# Patient Record
Sex: Female | Born: 1965 | Race: White | Hispanic: No | Marital: Married | Smoking: Former smoker
Health system: Southern US, Community
[De-identification: ages and names within clinical notes are randomized; demographics above are authoritative.]

## PROBLEM LIST (undated history)

## (undated) DIAGNOSIS — R000 Tachycardia, unspecified: Secondary | ICD-10-CM

## (undated) DIAGNOSIS — K219 Gastro-esophageal reflux disease without esophagitis: Secondary | ICD-10-CM

## (undated) DIAGNOSIS — E039 Hypothyroidism, unspecified: Secondary | ICD-10-CM

## (undated) DIAGNOSIS — T7840XA Allergy, unspecified, initial encounter: Secondary | ICD-10-CM

## (undated) HISTORY — DX: Hypothyroidism, unspecified: E03.9

## (undated) HISTORY — DX: Gastro-esophageal reflux disease without esophagitis: K21.9

## (undated) HISTORY — DX: Allergy, unspecified, initial encounter: T78.40XA

## (undated) HISTORY — DX: Tachycardia, unspecified: R00.0

---

## 2010-05-04 ENCOUNTER — Encounter: Payer: Self-pay | Admitting: Cardiology

## 2010-05-14 ENCOUNTER — Encounter: Payer: Self-pay | Admitting: Cardiology

## 2010-05-16 ENCOUNTER — Encounter: Payer: Self-pay | Admitting: Cardiology

## 2010-05-24 ENCOUNTER — Ambulatory Visit: Payer: Self-pay | Admitting: Cardiology

## 2010-05-24 DIAGNOSIS — I498 Other specified cardiac arrhythmias: Secondary | ICD-10-CM | POA: Insufficient documentation

## 2010-10-18 NOTE — Letter (Signed)
Summary: External Correspondence/ OFFICE VISIT [facility]  External Correspondence/ OFFICE VISIT [facility] [place]   Imported By: Dorise Hiss 05/16/2010 16:49:17  _____________________________________________________________________  External Attachment:    Type:   Image     Comment:   External Document

## 2010-10-18 NOTE — Assessment & Plan Note (Signed)
Summary: NP6/ TACKYCARDIA. PT HAS BCBS/. GD   Visit Type:  Initial Consult Primary Provider:  Golden Hurter  CC:  Tachycardia.  History of Present Illness: The patient presents for evaluation of tachycardia. She has had this off and on for many years. It comes and goes sporadically. She's had episodes lasting up to 2 hours but typically last for a few minutes. It goes away spontaneously though she has tried a couple of vagal maneuvers. She presented to her primary care and finally had this captured the other day. It was a regular tachycardia at a rate of 127 with no discernible P waves suggestive of a reentrant tachycardia. There was a slight R. prime in lead V1. She has had no presyncope or syncope with this. She says it's noticeable but not particularly symptomatic. She was started on 12 1/2 mg of metoprolol b.i.d. but has only been taking the evening dose as she does not want to be fatigued during the day. Otherwise exercises twice weekly and denies any chest pressure, neck or arm discomfort. She has no shortness of breath, PND or orthopnea.  Preventive Screening-Counseling & Management  Alcohol-Tobacco     Smoking Status: quit     Year Quit: 2006  Current Medications (verified): 1)  Metoprolol Tartrate 25 Mg Tabs (Metoprolol Tartrate) .... Take 1/2 Tablet By Mouth Two Times A Day 2)  Levothyroxine Sodium 100 Mcg Tabs (Levothyroxine Sodium) .... Take 1 Tablet By Mouth Once A Day  Allergies (verified): 1)  ! Asa  Comments:  Nurse/Medical Assistant: The patient's medications and allergies were verbally reviewed with the patient and were updated in the Medication and Allergy Lists.  Past History:  Past Medical History: Hypothyroidism  Past Surgical History: None  Family History: Father hyperglycemic Mother no cardiac disease No sudden death, no heart failure  Social History: Runner, broadcasting/film/video, high school Live with husband and step daughter Quit tobacco x 5 years (20  years sporadic) Smoking Status:  quit  Review of Systems       As stated in the HPI and negative for all other systems.   Vital Signs:  Patient profile:   45 year old female Height:      68 inches Weight:      169 pounds BMI:     25.79 Pulse rate:   73 / minute BP sitting:   114 / 73  (left arm) Cuff size:   regular  Vitals Entered By: Carlye Grippe (May 24, 2010 3:58 PM)  Nutrition Counseling: Patient's BMI is greater than 25 and therefore counseled on weight management options.  Physical Exam  General:  Well developed, well nourished, in no acute distress. Head:  normocephalic and atraumatic Eyes:  PERRLA/EOM intact; conjunctiva and lids normal. Mouth:  Teeth, gums and palate normal. Oral mucosa normal. Neck:  Neck supple, no JVD. No masses, thyromegaly or abnormal cervical nodes. Chest Wall:  no deformities or breast masses noted Lungs:  Clear bilaterally to auscultation and percussion. Abdomen:  Bowel sounds positive; abdomen soft and non-tender without masses, organomegaly, or hernias noted. No hepatosplenomegaly. Msk:  Back normal, normal gait. Muscle strength and tone normal. Extremities:  No clubbing or cyanosis. Neurologic:  Alert and oriented x 3. Skin:  Intact without lesions or rashes. Cervical Nodes:  no significant adenopathy Inguinal Nodes:  no significant adenopathy Psych:  Normal affect.   Detailed Cardiovascular Exam  Neck    Carotids: Carotids full and equal bilaterally without bruits.      Neck Veins: Normal,  no JVD.    Heart    Inspection: no deformities or lifts noted.      Palpation: normal PMI with no thrills palpable.      Auscultation: regular rate and rhythm, S1, S2 without murmurs, rubs, gallops, or clicks.    Vascular    Abdominal Aorta: no palpable masses, pulsations, or audible bruits.      Femoral Pulses: normal femoral pulses bilaterally.      Pedal Pulses: normal pedal pulses bilaterally.      Radial Pulses: normal  radial pulses bilaterally.      Peripheral Circulation: no clubbing, cyanosis, or edema noted with normal capillary refill.     EKG  Procedure date:  05/14/2010  Findings:      Sinus rhythm, rate 94, axis within normal limits, intervals within normal limits, no acute ST-T wave changes  Impression & Recommendations:  Problem # 1:  SUPRAVENTRICULAR TACHYCARDIA (ICD-427.89) The patient has a supraventricular tachycardia. I cannot discern the etiology specifically but it may be reentrant. We discussed further vagal maneuvers. She would rather not take the beta blocker so we talked about a pill in the pocket approach. I reviewed labs and her TSH and electrolytes were normal recently. Since these are not overly symptomatic and have been long-standing she prefers not to pursue more aggressive evaluation or treatment. However, she will let us know if she has any increasing symptoms.  Patient Instructions: 1)  Follow up as needed

## 2012-02-05 ENCOUNTER — Other Ambulatory Visit: Payer: Self-pay | Admitting: Family Medicine

## 2012-02-05 ENCOUNTER — Ambulatory Visit (HOSPITAL_COMMUNITY)
Admission: RE | Admit: 2012-02-05 | Discharge: 2012-02-05 | Disposition: A | Discharge status: Home / Self Care | Payer: BC Managed Care – PPO | Source: Ambulatory Visit | Attending: Family Medicine | Admitting: Family Medicine

## 2012-02-05 DIAGNOSIS — R1031 Right lower quadrant pain: Secondary | ICD-10-CM

## 2012-02-05 DIAGNOSIS — R509 Fever, unspecified: Secondary | ICD-10-CM | POA: Insufficient documentation

## 2012-02-05 DIAGNOSIS — R1011 Right upper quadrant pain: Secondary | ICD-10-CM | POA: Insufficient documentation

## 2012-02-05 DIAGNOSIS — R9389 Abnormal findings on diagnostic imaging of other specified body structures: Secondary | ICD-10-CM | POA: Insufficient documentation

## 2012-02-05 MED ORDER — IOHEXOL 300 MG/ML  SOLN
100.0000 mL | Freq: Once | INTRAMUSCULAR | Status: AC | PRN
  Administered 2012-02-05: 100 mL via INTRAVENOUS

## 2013-02-25 ENCOUNTER — Other Ambulatory Visit: Payer: Self-pay | Admitting: Nurse Practitioner

## 2013-03-18 ENCOUNTER — Other Ambulatory Visit: Payer: Self-pay | Admitting: Nurse Practitioner

## 2016-04-04 ENCOUNTER — Other Ambulatory Visit: Payer: Self-pay | Admitting: Family Medicine

## 2016-04-04 DIAGNOSIS — Z1231 Encounter for screening mammogram for malignant neoplasm of breast: Secondary | ICD-10-CM

## 2016-04-19 ENCOUNTER — Encounter (HOSPITAL_COMMUNITY): Payer: Self-pay | Admitting: Emergency Medicine

## 2016-04-19 ENCOUNTER — Emergency Department (HOSPITAL_COMMUNITY): Payer: BC Managed Care – PPO

## 2016-04-19 ENCOUNTER — Emergency Department (HOSPITAL_COMMUNITY)
Admission: EM | Admit: 2016-04-19 | Discharge: 2016-04-19 | Disposition: A | Discharge status: Home / Self Care | Payer: BC Managed Care – PPO | Attending: Emergency Medicine | Admitting: Emergency Medicine

## 2016-04-19 DIAGNOSIS — E039 Hypothyroidism, unspecified: Secondary | ICD-10-CM | POA: Insufficient documentation

## 2016-04-19 DIAGNOSIS — Z79899 Other long term (current) drug therapy: Secondary | ICD-10-CM | POA: Insufficient documentation

## 2016-04-19 DIAGNOSIS — Z87891 Personal history of nicotine dependence: Secondary | ICD-10-CM | POA: Insufficient documentation

## 2016-04-19 DIAGNOSIS — R0789 Other chest pain: Secondary | ICD-10-CM | POA: Diagnosis not present

## 2016-04-19 DIAGNOSIS — R079 Chest pain, unspecified: Secondary | ICD-10-CM | POA: Diagnosis present

## 2016-04-19 LAB — CBC WITH DIFFERENTIAL/PLATELET
Basophils Absolute: 0 10*3/uL (ref 0.0–0.1)
Basophils Relative: 0 %
Eosinophils Absolute: 0.3 10*3/uL (ref 0.0–0.7)
Eosinophils Relative: 5 %
HEMATOCRIT: 40 % (ref 36.0–46.0)
HEMOGLOBIN: 13.3 g/dL (ref 12.0–15.0)
LYMPHS ABS: 1.8 10*3/uL (ref 0.7–4.0)
LYMPHS PCT: 34 %
MCH: 31.6 pg (ref 26.0–34.0)
MCHC: 33.3 g/dL (ref 30.0–36.0)
MCV: 95 fL (ref 78.0–100.0)
MONOS PCT: 10 %
Monocytes Absolute: 0.5 10*3/uL (ref 0.1–1.0)
NEUTROS ABS: 2.6 10*3/uL (ref 1.7–7.7)
NEUTROS PCT: 51 %
Platelets: 215 10*3/uL (ref 150–400)
RBC: 4.21 MIL/uL (ref 3.87–5.11)
RDW: 13 % (ref 11.5–15.5)
WBC: 5.2 10*3/uL (ref 4.0–10.5)

## 2016-04-19 LAB — BASIC METABOLIC PANEL
ANION GAP: 5 (ref 5–15)
BUN: 17 mg/dL (ref 6–20)
CO2: 27 mmol/L (ref 22–32)
Calcium: 8.8 mg/dL — ABNORMAL LOW (ref 8.9–10.3)
Chloride: 105 mmol/L (ref 101–111)
Creatinine, Ser: 0.62 mg/dL (ref 0.44–1.00)
GFR calc Af Amer: >60 mL/min (ref >60)
GFR calc non Af Amer: >60 mL/min (ref >60)
GLUCOSE: 107 mg/dL — AB (ref 65–99)
POTASSIUM: 3.5 mmol/L (ref 3.5–5.1)
Sodium: 137 mmol/L (ref 135–145)

## 2016-04-19 LAB — HEPATIC FUNCTION PANEL
ALT: 18 U/L (ref 14–54)
AST: 21 U/L (ref 15–41)
Albumin: 4 g/dL (ref 3.5–5.0)
Alkaline Phosphatase: 60 U/L (ref 38–126)
BILIRUBIN DIRECT: 0.1 mg/dL (ref 0.1–0.5)
Indirect Bilirubin: 0.3 mg/dL (ref 0.3–0.9)
Total Bilirubin: 0.4 mg/dL (ref 0.3–1.2)
Total Protein: 6.8 g/dL (ref 6.5–8.1)

## 2016-04-19 LAB — TROPONIN I: Troponin I: <0.03 ng/mL (ref <0.03)

## 2016-04-19 LAB — I-STAT TROPONIN, ED: Troponin i, poc: 0 ng/mL (ref 0.00–0.08)

## 2016-04-19 MED ORDER — IBUPROFEN 800 MG PO TABS: 800.0000 mg | ORAL_TABLET | Freq: Three times a day (TID) | ORAL | 0 refills | Status: AC | PRN

## 2016-04-19 NOTE — ED Triage Notes (Signed)
Pt reports chest pain since this am. Pt reports radiation to left arm. Pt reports history of tachycardia. Pt reports went to PCP and was sent here.

## 2016-04-19 NOTE — Discharge Instructions (Signed)
Follow-up with the cardiology group in [place] in 1-2 weeksThe doctor use all before with his cardiology group was Dr. Antoine Poche

## 2016-04-19 NOTE — ED Provider Notes (Signed)
[facility] DEPT Provider Note   CSN: [id number] Arrival date & time: 04/19/16  1534  First Provider Contact:  First MD Initiated Contact with Patient 04/19/16 1554        History   Chief Complaint Chief Complaint  Patient presents with  . Chest Pain    HPI Leslie Gardner is a 50 y.o. female.  Patient complains of left-sided chest pain running down her left arm. Patient has a history of supraventricular tachycardia that she saw a cardiologist years ago about. Patient decided not to have ablation at that time but she has approximately 2 episodes of tachycardia month   The history is provided by the patient. No language interpreter was used.  Chest Pain   This is a new problem. The current episode started 6 to 12 hours ago. The problem occurs rarely. The problem has been resolved. Associated with: Nothing. Pain location: Left chest. The pain is at a severity of 4/10. The pain is mild. The quality of the pain is described as dull. Radiates to: Left arm. Exacerbated by: Worsened by nothing. Not related to exertion or deep breath. Pertinent negatives include no abdominal pain, no back pain, no cough and no headaches.  Pertinent negatives for past medical history include no seizures.    Past Medical History:  Diagnosis Date  . Hypothyroidism     Patient Active Problem List   Diagnosis Date Noted  . SUPRAVENTRICULAR TACHYCARDIA 05/24/2010    History reviewed. No pertinent surgical history.  OB History    No data available       Home Medications    Prior to Admission medications   Medication Sig Start Date End Date Taking? Authorizing Provider  Calcium-Vitamin D-Vitamin K (VIACTIV PO) Take 1 each by mouth once a week.   Yes Historical Provider, MD  Cyanocobalamin (B-12 PO) Take 1 tablet by mouth once a week.   Yes Historical Provider, MD  levothyroxine (SYNTHROID, LEVOTHROID) 100 MCG tablet TAKE 1 TABLET BY MOUTH DAILY 02/25/13  Yes Mary-Margaret Daphine Deutscher, FNP  metoprolol  tartrate (LOPRESSOR) 25 MG tablet Take 25 mg by mouth daily as needed (for tachycardia).   Yes Historical Provider, MD  ibuprofen (ADVIL,MOTRIN) 800 MG tablet Take 1 tablet (800 mg total) by mouth every 8 (eight) hours as needed for moderate pain. 04/19/16   Bethann Berkshire, MD    Family History History reviewed. No pertinent family history.  Social History Social History  Substance Use Topics  . Smoking status: Former Smoker    Types: Cigarettes    Quit date: 10/15/2006  . Smokeless tobacco: Never Used  . Alcohol use Yes     Comment: daily wine use     Allergies   Aspirin   Review of Systems Review of Systems  Constitutional: Negative for appetite change and fatigue.  HENT: Negative for congestion, ear discharge and sinus pressure.   Eyes: Negative for discharge.  Respiratory: Negative for cough.   Cardiovascular: Positive for chest pain.  Gastrointestinal: Negative for abdominal pain and diarrhea.  Genitourinary: Negative for frequency and hematuria.  Musculoskeletal: Negative for back pain.  Skin: Negative for rash.  Neurological: Negative for seizures and headaches.  Psychiatric/Behavioral: Negative for hallucinations.     Physical Exam Updated Vital Signs BP 117/81   Pulse 72   Temp 98.3 F (36.8 C) (Oral)   Resp 21   Ht  (1.727 m)   Wt 180 lb (81.6 kg)   SpO2 97%   BMI 27.37 kg/m   Physical Exam  Constitutional: She is oriented to person, place, and time. She appears well-developed.  HENT:  Head: Normocephalic.  Eyes: Conjunctivae and EOM are normal. No scleral icterus.  Neck: Neck supple. No thyromegaly present.  Cardiovascular: Normal rate and regular rhythm.  Exam reveals no gallop and no friction rub.   No murmur heard. Pulmonary/Chest: No stridor. She has no wheezes. She has no rales. She exhibits no tenderness.  Abdominal: She exhibits no distension. There is no tenderness. There is no rebound.  Musculoskeletal: Normal range of motion. She  exhibits no edema.  Lymphadenopathy:    She has no cervical adenopathy.  Neurological: She is oriented to person, place, and time. She exhibits normal muscle tone. Coordination normal.  Skin: No rash noted. No erythema.  Psychiatric: She has a normal mood and affect. Her behavior is normal.     ED Treatments / Results  Labs (all labs ordered are listed, but only abnormal results are displayed) Labs Reviewed  BASIC METABOLIC PANEL - Abnormal; Notable for the following:       Result Value   Glucose, Bld 107 (*)    Calcium 8.8 (*)    All other components within normal limits  TROPONIN I  CBC WITH DIFFERENTIAL/PLATELET  HEPATIC FUNCTION PANEL  I-STAT TROPOININ, ED    EKG  EKG Interpretation  Date/Time:  Wednesday April 19 2016 15:35:37 EDT Ventricular Rate:  73 PR Interval:  142 QRS Duration: 98 QT Interval:  420 QTC Calculation: 462 R Axis:   69 Text Interpretation:  Normal sinus rhythm Possible Left atrial enlargement Incomplete right bundle branch block Borderline ECG Confirmed by Montrey Buist  MD, Hannan Hutmacher [phone]) on 04/19/2016 4:29:51 PM       Radiology Dg Chest 2 View  Result Date: 04/19/2016 CLINICAL DATA:  Left-sided chest pain with radicular symptoms into left upper extremity for 1 day EXAM: CHEST  2 VIEW COMPARISON:  None. FINDINGS: The lungs are clear. The heart size and pulmonary vascularity are normal. No adenopathy. No pneumothorax. No bone lesions. IMPRESSION: No edema or consolidation. Electronically Signed   By: Bretta Bang III M.D.   On: 04/19/2016 16:35    Procedures Procedures (including critical care time)  Medications Ordered in ED Medications - No data to display   Initial Impression / Assessment and Plan / ED Course  I have reviewed the triage vital signs and the nursing notes.  Pertinent labs & imaging results that were available during my care of the patient were reviewed by me and considered in my medical decision making (see chart for  details).  Clinical Course    Patient with chest pain radiating down her left arm. EKG unremarkable troponin 2 is normal. Patient improved while in the emergency department. Patient is sent home with Motrin and referred to cardiology because she has had multiple episodes of SVT consult cardiology 6 years ago about it.  Final Clinical Impressions(s) / ED Diagnoses   Final diagnoses:  Other chest pain    New Prescriptions New Prescriptions   IBUPROFEN (ADVIL,MOTRIN) 800 MG TABLET    Take 1 tablet (800 mg total) by mouth every 8 (eight) hours as needed for moderate pain.     Bethann Berkshire, MD 04/19/16 1945

## 2016-05-05 ENCOUNTER — Ambulatory Visit
Admission: RE | Admit: 2016-05-05 | Discharge: 2016-05-05 | Disposition: A | Discharge status: Home / Self Care | Payer: BC Managed Care – PPO | Source: Ambulatory Visit | Attending: Family Medicine | Admitting: Family Medicine

## 2016-05-05 DIAGNOSIS — Z1231 Encounter for screening mammogram for malignant neoplasm of breast: Secondary | ICD-10-CM

## 2017-08-23 ENCOUNTER — Ambulatory Visit: Payer: BC Managed Care – PPO | Admitting: Urgent Care

## 2017-08-23 ENCOUNTER — Encounter: Payer: Self-pay | Admitting: Urgent Care

## 2017-08-23 ENCOUNTER — Other Ambulatory Visit: Payer: Self-pay

## 2017-08-23 VITALS — BP 118/76 | HR 78 | Temp 98.6°F | Resp 16 | Ht 66.5 in | Wt 173.4 lb

## 2017-08-23 DIAGNOSIS — B9789 Other viral agents as the cause of diseases classified elsewhere: Secondary | ICD-10-CM

## 2017-08-23 DIAGNOSIS — J069 Acute upper respiratory infection, unspecified: Secondary | ICD-10-CM

## 2017-08-23 DIAGNOSIS — J029 Acute pharyngitis, unspecified: Secondary | ICD-10-CM

## 2017-08-23 MED ORDER — BENZONATATE 100 MG PO CAPS: 100.0000 mg | ORAL_CAPSULE | Freq: Three times a day (TID) | ORAL | 0 refills | Status: DC | PRN

## 2017-08-23 MED ORDER — HYDROCODONE-HOMATROPINE 5-1.5 MG/5ML PO SYRP: 5.0000 mL | ORAL_SOLUTION | Freq: Every evening | ORAL | 0 refills | Status: DC | PRN

## 2017-08-23 MED ORDER — LORATADINE 10 MG PO TABS: 10.0000 mg | ORAL_TABLET | Freq: Every day | ORAL | 11 refills | Status: AC

## 2017-08-23 MED ORDER — PSEUDOEPHEDRINE HCL ER 120 MG PO TB12: 120.0000 mg | ORAL_TABLET | Freq: Two times a day (BID) | ORAL | 3 refills | Status: DC

## 2017-08-23 NOTE — Progress Notes (Signed)
  MRN: [id number] DOB: 03/21/1966  Subjective:   Leslie PolesLynda Gardner is a 51 y.o. female presenting for 10 day history of productive cough, sore throat, scratchy and raspy throat worse when she lays down. Has a history of allergies this time of year. Denies fever, sinus pain, chest pain, chest congestion, shob, wheezing. Patient quit smoking 12 years ago.   [name] has a current medication list which includes the following prescription(s): calcium-vitamin d-vitamin k, cyanocobalamin, dextromethorphan polistirex, ibuprofen, levothyroxine, omeprazole, OVER THE COUNTER MEDICATION, and metoprolol tartrate. Also is allergic to aspirin.  [name]  has a past medical history of Hypothyroidism. Denies past surgical history.  Objective:   Vitals: BP 118/76 (BP Location: Left Arm, Patient Position: Sitting, Cuff Size: Large)   Pulse 78   Temp 98.6 F (37 C) (Oral)   Resp 16   Ht 5' 6.5" (1.689 m)   Wt 173 lb 6.4 oz (78.7 kg)   SpO2 96%   BMI 27.57 kg/m   Physical Exam  Constitutional: She is oriented to person, place, and time. She appears well-developed and well-nourished.  HENT:  TM's intact bilaterally, no effusions or erythema. Nasal turbinates pink and moist, nasal passages patent. Oropharynx with thick streaks of post-nasal drainage, mucous membranes moist.  Eyes: Right eye exhibits no discharge. Left eye exhibits no discharge.  Cardiovascular: Normal rate, regular rhythm and intact distal pulses. Exam reveals no gallop and no friction rub.  No murmur heard. Pulmonary/Chest: No respiratory distress. She has no wheezes. She has no rales.  Neurological: She is alert and oriented to person, place, and time.  Skin: Skin is warm and dry.   Assessment and Plan :   1. Viral URI with cough 2. Sore throat - Will manage supportively for viral illness. Return-to-clinic precautions discussed, patient verbalized understanding.   Wallis BambergMario Leyah Bocchino, PA-C [facility] [phone] 08/23/2017  4:50 PM

## 2017-08-23 NOTE — Patient Instructions (Addendum)
Upper Respiratory Infection, Adult Most upper respiratory infections (URIs) are a viral infection of the air passages leading to the lungs. A URI affects the nose, throat, and upper air passages. The most common type of URI is nasopharyngitis and is typically referred to as "the common cold." URIs run their course and usually go away on their own. Most of the time, a URI does not require medical attention, but sometimes a bacterial infection in the upper airways can follow a viral infection. This is called a secondary infection. Sinus and middle ear infections are common types of secondary upper respiratory infections. Bacterial pneumonia can also complicate a URI. A URI can worsen asthma and chronic obstructive pulmonary disease (COPD). Sometimes, these complications can require emergency medical care and may be life threatening. What are the causes? Almost all URIs are caused by viruses. A virus is a type of germ and can spread from one person to another. What increases the risk? You may be at risk for a URI if:  You smoke.  You have chronic heart or lung disease.  You have a weakened defense (immune) system.  You are very young or very old.  You have nasal allergies or asthma.  You work in crowded or poorly ventilated areas.  You work in health care facilities or schools. What are the signs or symptoms? Symptoms typically develop 2-3 days after you come in contact with a cold virus. Most viral URIs last 7-10 days. However, viral URIs from the influenza virus (flu virus) can last 14-18 days and are typically more severe. Symptoms may include:  Runny or stuffy (congested) nose.  Sneezing.  Cough.  Sore throat.  Headache.  Fatigue.  Fever.  Loss of appetite.  Pain in your forehead, behind your eyes, and over your cheekbones (sinus pain).  Muscle aches. How is this diagnosed? Your health care provider may diagnose a URI by:  Physical exam.  Tests to check that your  symptoms are not due to another condition such as:  Strep throat.  Sinusitis.  Pneumonia.  Asthma. How is this treated? A URI goes away on its own with time. It cannot be cured with medicines, but medicines may be prescribed or recommended to relieve symptoms. Medicines may help:  Reduce your fever.  Reduce your cough.  Relieve nasal congestion. Follow these instructions at home:  Take medicines only as directed by your health care provider.  Gargle warm saltwater or take cough drops to comfort your throat as directed by your health care provider.  Use a warm mist humidifier or inhale steam from a shower to increase air moisture. This may make it easier to breathe.  Drink enough fluid to keep your urine clear or pale yellow.  Eat soups and other clear broths and maintain good nutrition.  Rest as needed.  Return to work when your temperature has returned to normal or as your health care provider advises. You may need to stay home longer to avoid infecting others. You can also use a face mask and careful hand washing to prevent spread of the virus.  Increase the usage of your inhaler if you have asthma.  Do not use any tobacco products, including cigarettes, chewing tobacco, or electronic cigarettes. If you need help quitting, ask your health care provider. How is this prevented? The best way to protect yourself from getting a cold is to practice good hygiene.  Avoid oral or hand contact with people with cold symptoms.  Wash your hands often if contact   occurs. There is no clear evidence that vitamin C, vitamin E, echinacea, or exercise reduces the chance of developing a cold. However, it is always recommended to get plenty of rest, exercise, and practice good nutrition. Contact a health care provider if:  You are getting worse rather than better.  Your symptoms are not controlled by medicine.  You have chills.  You have worsening shortness of breath.  You have brown  or red mucus.  You have yellow or brown nasal discharge.  You have pain in your face, especially when you bend forward.  You have a fever.  You have swollen neck glands.  You have pain while swallowing.  You have white areas in the back of your throat. Get help right away if:  You have severe or persistent:  Headache.  Ear pain.  Sinus pain.  Chest pain.  You have chronic lung disease and any of the following:  Wheezing.  Prolonged cough.  Coughing up blood.  A change in your usual mucus.  You have a stiff neck.  You have changes in your:  Vision.  Hearing.  Thinking.  Mood. This information is not intended to replace advice given to you by your health care provider. Make sure you discuss any questions you have with your health care provider. Document Released: 02/28/2001 Document Revised: 05/07/2016 Document Reviewed: 12/10/2013 Elsevier Interactive Patient Education  2017 Elsevier Inc.     Cough, Adult Coughing is a reflex that clears your throat and your airways. Coughing helps to heal and protect your lungs. It is normal to cough occasionally, but a cough that happens with other symptoms or lasts a long time may be a sign of a condition that needs treatment. A cough may last only 2-3 weeks (acute), or it may last longer than 8 weeks (chronic). What are the causes? Coughing is commonly caused by:  Breathing in substances that irritate your lungs.  A viral or bacterial respiratory infection.  Allergies.  Asthma.  Postnasal drip.  Smoking.  Acid backing up from the stomach into the esophagus (gastroesophageal reflux).  Certain medicines.  Chronic lung problems, including COPD (or rarely, lung cancer).  Other medical conditions such as heart failure. Follow these instructions at home: Pay attention to any changes in your symptoms. Take these actions to help with your discomfort:  Take medicines only as told by your health care  provider.  If you were prescribed an antibiotic medicine, take it as told by your health care provider. Do not stop taking the antibiotic even if you start to feel better.  Talk with your health care provider before you take a cough suppressant medicine.  Drink enough fluid to keep your urine clear or pale yellow.  If the air is dry, use a cold steam vaporizer or humidifier in your bedroom or your home to help loosen secretions.  Avoid anything that causes you to cough at work or at home.  If your cough is worse at night, try sleeping in a semi-upright position.  Avoid cigarette smoke. If you smoke, quit smoking. If you need help quitting, ask your health care provider.  Avoid caffeine.  Avoid alcohol.  Rest as needed. Contact a health care provider if:  You have new symptoms.  You cough up pus.  Your cough does not get better after 2-3 weeks, or your cough gets worse.  You cannot control your cough with suppressant medicines and you are losing sleep.  You develop pain that is getting worse or pain   that is not controlled with pain medicines.  You have a fever.  You have unexplained weight loss.  You have night sweats. Get help right away if:  You cough up blood.  You have difficulty breathing.  Your heartbeat is very fast. This information is not intended to replace advice given to you by your health care provider. Make sure you discuss any questions you have with your health care provider. Document Released: 03/03/2011 Document Revised: 02/10/2016 Document Reviewed: 11/11/2014 Elsevier Interactive Patient Education  2017 Elsevier Inc.     IF you received an x-ray today, you will receive an invoice from [facility]. Please contact [facility] at [phone] with questions or concerns regarding your invoice.   IF you received labwork today, you will receive an invoice from [facility]. Please contact [facility] at [phone] with questions or  concerns regarding your invoice.   Our billing staff will not be able to assist you with questions regarding bills from these companies.  You will be contacted with the lab results as soon as they are available. The fastest way to get your results is to activate your My Chart account. Instructions are located on the last page of this paperwork. If you have not heard from us regarding the results in 2 weeks, please contact this office.     

## 2017-08-31 ENCOUNTER — Ambulatory Visit: Payer: BC Managed Care – PPO | Admitting: Urgent Care

## 2017-08-31 ENCOUNTER — Other Ambulatory Visit: Payer: Self-pay

## 2017-08-31 ENCOUNTER — Encounter: Payer: Self-pay | Admitting: Urgent Care

## 2017-08-31 ENCOUNTER — Ambulatory Visit: Payer: Self-pay | Admitting: Urgent Care

## 2017-08-31 VITALS — BP 110/70 | HR 92 | Temp 97.9°F | Resp 16 | Ht 66.5 in | Wt 177.4 lb

## 2017-08-31 DIAGNOSIS — B9789 Other viral agents as the cause of diseases classified elsewhere: Secondary | ICD-10-CM

## 2017-08-31 DIAGNOSIS — J069 Acute upper respiratory infection, unspecified: Secondary | ICD-10-CM

## 2017-08-31 DIAGNOSIS — J029 Acute pharyngitis, unspecified: Secondary | ICD-10-CM

## 2017-08-31 MED ORDER — BENZONATATE 100 MG PO CAPS: 100.0000 mg | ORAL_CAPSULE | Freq: Three times a day (TID) | ORAL | 0 refills | Status: DC | PRN

## 2017-08-31 NOTE — Patient Instructions (Addendum)
For sore throat try using a honey-based tea. Use 3 teaspoons of honey with juice squeezed from half lemon. Place shaved pieces of ginger into 1/2-1 cup of water and warm over stove top. Then mix the ingredients and repeat every 4 hours as needed.    Cough, Adult Coughing is a reflex that clears your throat and your airways. Coughing helps to heal and protect your lungs. It is normal to cough occasionally, but a cough that happens with other symptoms or lasts a long time may be a sign of a condition that needs treatment. A cough may last only 2-3 weeks (acute), or it may last longer than 8 weeks (chronic). What are the causes? Coughing is commonly caused by:  Breathing in substances that irritate your lungs.  A viral or bacterial respiratory infection.  Allergies.  Asthma.  Postnasal drip.  Smoking.  Acid backing up from the stomach into the esophagus (gastroesophageal reflux).  Certain medicines.  Chronic lung problems, including COPD (or rarely, lung cancer).  Other medical conditions such as heart failure.  Follow these instructions at home: Pay attention to any changes in your symptoms. Take these actions to help with your discomfort:  Take medicines only as told by your health care provider. ? If you were prescribed an antibiotic medicine, take it as told by your health care provider. Do not stop taking the antibiotic even if you start to feel better. ? Talk with your health care provider before you take a cough suppressant medicine.  Drink enough fluid to keep your urine clear or pale yellow.  If the air is dry, use a cold steam vaporizer or humidifier in your bedroom or your home to help loosen secretions.  Avoid anything that causes you to cough at work or at home.  If your cough is worse at night, try sleeping in a semi-upright position.  Avoid cigarette smoke. If you smoke, quit smoking. If you need help quitting, ask your health care provider.  Avoid  caffeine.  Avoid alcohol.  Rest as needed.  Contact a health care provider if:  You have new symptoms.  You cough up pus.  Your cough does not get better after 2-3 weeks, or your cough gets worse.  You cannot control your cough with suppressant medicines and you are losing sleep.  You develop pain that is getting worse or pain that is not controlled with pain medicines.  You have a fever.  You have unexplained weight loss.  You have night sweats. Get help right away if:  You cough up blood.  You have difficulty breathing.  Your heartbeat is very fast. This information is not intended to replace advice given to you by your health care provider. Make sure you discuss any questions you have with your health care provider. Document Released: 03/03/2011 Document Revised: 02/10/2016 Document Reviewed: 11/11/2014 Elsevier Interactive Patient Education  2017 Elsevier Inc.     IF you received an x-ray today, you will receive an invoice from [facility]. Please contact [facility] at [phone] with questions or concerns regarding your invoice.   IF you received labwork today, you will receive an invoice from [facility]. Please contact [facility] at [phone] with questions or concerns regarding your invoice.   Our billing staff will not be able to assist you with questions regarding bills from these companies.  You will be contacted with the lab results as soon as they are available. The fastest way to get your results is to activate your My Chart   account. Instructions are located on the last page of this paperwork. If you have not heard from us regarding the results in 2 weeks, please contact this office.     

## 2017-08-31 NOTE — Progress Notes (Signed)
    MRN: [id number] DOB: 09/09/1966  Subjective:   Leslie Gardner is a 51 y.o. female presenting for follow up on viral URI with cough, sore throat. Reports improvement in her symptoms since her last visit. She has not started her Kimberlee Nearingessalon because she went to the wrong pharmacy. She has been hydrating well, using loratadine and Sudafed in the morning. The cough syrup is helping as well. She admits having rib pain with her coughing fits now. Denies fever, hemoptysis, shob, wheezing, n/v, abdominal pain.  Leslie Gardner has a current medication list which includes the following prescription(s): calcium-vitamin d-vitamin k, cyanocobalamin, hydrocodone-homatropine, ibuprofen, levothyroxine, loratadine, omeprazole, pseudoephedrine, and benzonatate. Also is allergic to aspirin.  Leslie Gardner  has a past medical history of Allergy, GERD (gastroesophageal reflux disease), Hypothyroidism, and Tachycardia. Also  has no past surgical history on file.  Objective:   Vitals: BP 110/70   Pulse 92   Temp 97.9 F (36.6 C)   Resp 16   Ht 5' 6.5" (1.689 m)   Wt 177 lb 6.4 oz (80.5 kg)   SpO2 97%   BMI 28.20 kg/m   Physical Exam  Constitutional: She is oriented to person, place, and time. She appears well-developed and well-nourished.  Cardiovascular: Normal rate, regular rhythm and intact distal pulses. Exam reveals no gallop and no friction rub.  No murmur heard. Pulmonary/Chest: No respiratory distress. She has no wheezes. She has no rales.  Neurological: She is alert and oriented to person, place, and time.   Assessment and Plan :   1. Viral URI with cough 2. Sore throat - Improving, refilled benzonatate. Stop Mucinex. Return-to-clinic precautions discussed, patient verbalized understanding. Consider prednisone if her cough persists or perpetuates rib pain.   Wallis BambergMario Teodora Baumgarten, PA-C [facility] [phone] 08/31/2017 11:33 AM

## 2017-09-06 ENCOUNTER — Ambulatory Visit: Payer: BC Managed Care – PPO | Admitting: Urgent Care

## 2017-09-06 ENCOUNTER — Encounter: Payer: Self-pay | Admitting: Urgent Care

## 2017-09-06 VITALS — BP 119/82 | HR 86 | Temp 98.6°F | Resp 16 | Ht 66.5 in | Wt 176.2 lb

## 2017-09-06 DIAGNOSIS — R05 Cough: Secondary | ICD-10-CM

## 2017-09-06 DIAGNOSIS — Z9109 Other allergy status, other than to drugs and biological substances: Secondary | ICD-10-CM

## 2017-09-06 DIAGNOSIS — R059 Cough, unspecified: Secondary | ICD-10-CM

## 2017-09-06 MED ORDER — PSEUDOEPHEDRINE HCL ER 120 MG PO TB12: 120.0000 mg | ORAL_TABLET | Freq: Two times a day (BID) | ORAL | 5 refills | Status: DC

## 2017-09-06 MED ORDER — BENZONATATE 100 MG PO CAPS: 100.0000 mg | ORAL_CAPSULE | Freq: Three times a day (TID) | ORAL | 5 refills | Status: DC | PRN

## 2017-09-06 MED ORDER — FLUTICASONE PROPIONATE 50 MCG/ACT NA SUSP: 2.0000 | Freq: Every day | NASAL | 11 refills | Status: AC

## 2017-09-06 NOTE — Patient Instructions (Addendum)
Cough, Adult Coughing is a reflex that clears your throat and your airways. Coughing helps to heal and protect your lungs. It is normal to cough occasionally, but a cough that happens with other symptoms or lasts a long time may be a sign of a condition that needs treatment. A cough may last only 2-3 weeks (acute), or it may last longer than 8 weeks (chronic). What are the causes? Coughing is commonly caused by:  Breathing in substances that irritate your lungs.  A viral or bacterial respiratory infection.  Allergies.  Asthma.  Postnasal drip.  Smoking.  Acid backing up from the stomach into the esophagus (gastroesophageal reflux).  Certain medicines.  Chronic lung problems, including COPD (or rarely, lung cancer).  Other medical conditions such as heart failure.  Follow these instructions at home: Pay attention to any changes in your symptoms. Take these actions to help with your discomfort:  Take medicines only as told by your health care provider. ? If you were prescribed an antibiotic medicine, take it as told by your health care provider. Do not stop taking the antibiotic even if you start to feel better. ? Talk with your health care provider before you take a cough suppressant medicine.  Drink enough fluid to keep your urine clear or pale yellow.  If the air is dry, use a cold steam vaporizer or humidifier in your bedroom or your home to help loosen secretions.  Avoid anything that causes you to cough at work or at home.  If your cough is worse at night, try sleeping in a semi-upright position.  Avoid cigarette smoke. If you smoke, quit smoking. If you need help quitting, ask your health care provider.  Avoid caffeine.  Avoid alcohol.  Rest as needed.  Contact a health care provider if:  You have new symptoms.  You cough up pus.  Your cough does not get better after 2-3 weeks, or your cough gets worse.  You cannot control your cough with suppressant  medicines and you are losing sleep.  You develop pain that is getting worse or pain that is not controlled with pain medicines.  You have a fever.  You have unexplained weight loss.  You have night sweats. Get help right away if:  You cough up blood.  You have difficulty breathing.  Your heartbeat is very fast. This information is not intended to replace advice given to you by your health care provider. Make sure you discuss any questions you have with your health care provider. Document Released: 03/03/2011 Document Revised: 02/10/2016 Document Reviewed: 11/11/2014 Elsevier Interactive Patient Education  2018 Elsevier Inc.     IF you received an x-ray today, you will receive an invoice from [facility]. Please contact [facility] at [phone] with questions or concerns regarding your invoice.   IF you received labwork today, you will receive an invoice from [facility]. Please contact [facility] at [phone] with questions or concerns regarding your invoice.   Our billing staff will not be able to assist you with questions regarding bills from these companies.  You will be contacted with the lab results as soon as they are available. The fastest way to get your results is to activate your My Chart account. Instructions are located on the last page of this paperwork. If you have not heard from us regarding the results in 2 weeks, please contact this office.      

## 2017-09-06 NOTE — Progress Notes (Signed)
    MRN: [id number] DOB: 02/25/1966  Subjective:   Leslie Gardner is a 51 y.o. female presenting for follow up on viral URI with cough. Her symptoms have steadily improved with management of allergies, supportive care. Still has a mild dry cough. She would like a refill of Tessalon and Sudafed, uses cough syrup sparingly and does not need a refill. Denies fever, sinus pain, sinus congestion, sore throat, chest pain, shob, wheezing. Denies smoking cigarettes.  Leslie Gardner has a current medication list which includes the following prescription(s): benzonatate, calcium-vitamin d-vitamin k, cyanocobalamin, ibuprofen, levothyroxine, loratadine, omeprazole, and pseudoephedrine. Also is allergic to aspirin.  Leslie Gardner  has a past medical history of Allergy, GERD (gastroesophageal reflux disease), Hypothyroidism, and Tachycardia. Also  has no past surgical history on file.  Objective:   Vitals: BP 119/82   Pulse 86   Temp 98.6 F (37 C) (Oral)   Resp 16   Ht 5' 6.5" (1.689 m)   Wt 176 lb 3.2 oz (79.9 kg)   SpO2 95%   BMI 28.01 kg/m   Physical Exam  Constitutional: She is oriented to person, place, and time. She appears well-developed and well-nourished.  Cardiovascular: Normal rate.  Pulmonary/Chest: Effort normal.  Neurological: She is alert and oriented to person, place, and time.   Assessment and Plan :   Cough  Environmental allergies  Improved, continue supportive care, refills provided. Add Flonase after symptoms resolve. Return-to-clinic precautions discussed, patient verbalized understanding.   Wallis BambergMario Emmer Lillibridge, PA-C [facility] [phone] 09/06/2017 5:45 PM

## 2018-03-20 ENCOUNTER — Ambulatory Visit: Payer: Self-pay | Admitting: Urgent Care

## 2018-04-04 ENCOUNTER — Ambulatory Visit: Payer: BC Managed Care – PPO | Admitting: Urgent Care

## 2018-04-04 ENCOUNTER — Encounter: Payer: Self-pay | Admitting: Urgent Care

## 2018-04-04 VITALS — BP 142/70 | HR 83 | Temp 98.3°F | Resp 16 | Ht 66.5 in | Wt 176.6 lb

## 2018-04-04 DIAGNOSIS — S6991XA Unspecified injury of right wrist, hand and finger(s), initial encounter: Secondary | ICD-10-CM

## 2018-04-04 DIAGNOSIS — W19XXXA Unspecified fall, initial encounter: Secondary | ICD-10-CM | POA: Diagnosis not present

## 2018-04-04 DIAGNOSIS — S6990XA Unspecified injury of unspecified wrist, hand and finger(s), initial encounter: Secondary | ICD-10-CM

## 2018-04-04 DIAGNOSIS — Z1231 Encounter for screening mammogram for malignant neoplasm of breast: Secondary | ICD-10-CM

## 2018-04-04 DIAGNOSIS — Z1239 Encounter for other screening for malignant neoplasm of breast: Secondary | ICD-10-CM

## 2018-04-04 DIAGNOSIS — M79644 Pain in right finger(s): Secondary | ICD-10-CM

## 2018-04-04 NOTE — Progress Notes (Signed)
    MRN: [id number] DOB: 06/24/1966  Subjective:   Leslie PolesLynda Gardner is a 52 y.o. female presenting for 2.5-week history of right hand injury.  Patient reports that she was walking when she tripped and fell breaking her fall with her right hand.  Patient reports that she hyperextended her 3rd-5th fingers.  She has been using icing, wrist brace and a hand splint.  She reports that she did get x-rays done which were negative for fracture.  Denies fever, redness, bony deformity, warmth.  She has limited her right hand use significantly.  [name] has a current medication list which includes the following prescription(s): calcium-vitamin d-vitamin k, cyanocobalamin, fluticasone, ibuprofen, levothyroxine, and loratadine. Also is allergic to aspirin.  [name]  has a past medical history of Allergy, GERD (gastroesophageal reflux disease), Hypothyroidism, and Tachycardia. Also denies past surgical history.  Objective:   Vitals: BP (!) 142/70   Pulse 83   Temp 98.3 F (36.8 C) (Oral)   Resp 16   Ht 5' 6.5" (1.689 m)   Wt 176 lb 9.6 oz (80.1 kg)   SpO2 97%   BMI 28.08 kg/m   Physical Exam  Constitutional: She is oriented to person, place, and time. She appears well-developed and well-nourished.  Cardiovascular: Normal rate.  Pulmonary/Chest: Effort normal.  Musculoskeletal:       Right hand: She exhibits decreased range of motion (full flexion of 3rd-5th fingers) and swelling (trace over 3rd-5th right fingers). She exhibits no bony tenderness and no deformity. Normal sensation noted. Normal strength noted.  Neurological: She is alert and oriented to person, place, and time.   Assessment and Plan :   Finger injury, initial encounter  Hand injury, right, initial encounter  Fall, initial encounter  Screening for breast cancer - Plan: MM Digital Screening  I offered patient on x-ray which would both agreed we will not provide additional diagnostic information.  Patient prefers to hold off on  physical therapy due to cost burden.  Counseled that she needs to stop using the splint over her fingers and that she should start doing range of motion and strength exercises using a stress ball for her hand.  If her symptoms persist or after, we will pursue physical therapy.  Wallis BambergMario Taiwan Talcott, PA-C [facility] [phone] 04/04/2018  4:01 PM

## 2018-04-04 NOTE — Patient Instructions (Addendum)
For a consult of physical therapy, please contact [facility].  [phone] Please call Julieanne CottonJennifer Auman, Office Manager to set up an appointment.  Take meloxicam 7.5-15mg  once daily for 2 weeks. Do not use the splint anymore, perform basic strength and rehab exercises twice daily with a stress ball. Do this within your pain tolerance. If you have no improvement or definitely if your symptoms are worsening, then contact [facility]. We can always repeat x-ray here as well if you need that.      IF you received an x-ray today, you will receive an [facility] from [facility]. Please contact [facility] at [phone] with questions or concerns regarding your invoice.   IF you received labwork today, you will receive an invoice from [facility]. Please contact [facility] at [phone] with questions or concerns regarding your invoice.   Our billing staff will not be able to assist you with questions regarding bills from these companies.  You will be contacted with the lab results as soon as they are available. The fastest way to get your results is to activate your My Chart account. Instructions are located on the last page of this paperwork. If you have not heard from us regarding the results in 2 weeks, please contact this office.

## 2018-05-01 ENCOUNTER — Ambulatory Visit
Admission: RE | Admit: 2018-05-01 | Discharge: 2018-05-01 | Disposition: A | Discharge status: Home / Self Care | Payer: BC Managed Care – PPO | Source: Ambulatory Visit | Attending: Urgent Care | Admitting: Urgent Care

## 2018-05-01 DIAGNOSIS — Z1239 Encounter for other screening for malignant neoplasm of breast: Secondary | ICD-10-CM

## 2018-06-14 ENCOUNTER — Telehealth: Payer: Self-pay | Admitting: Urgent Care

## 2018-06-14 NOTE — Telephone Encounter (Signed)
Levothyroxine refill Last Refill: 02/16/13   # 90  1 refill  (expired) Last OV: 09/06/17 PCP: [facility]  Pharmacy:[facility], [address] [employer]

## 2018-06-18 NOTE — Telephone Encounter (Signed)
Pt called in stated that she was a pt of [facility] and has been on this med for 15 years.  She would like to know why it was refused.  She only has a couple of pills left and needs to figure what she needs to to asap?    [facility]  Best call back number   [phone]

## 2018-06-19 NOTE — Telephone Encounter (Signed)
Patient has never been seen in our practice for thyroid issues.  Left detailed message she would need to call who has been filling this prescription for her or come in and establish care with a provider here, to have medication management through [facility] at [facility]

## 2018-06-26 ENCOUNTER — Telehealth: Payer: Self-pay | Admitting: *Deleted

## 2018-06-26 NOTE — Telephone Encounter (Signed)
Left message we will not be able to fill this medication.  We have never filled this medication for this patient.

## 2018-07-15 ENCOUNTER — Encounter: Payer: Self-pay | Admitting: Emergency Medicine

## 2018-07-15 ENCOUNTER — Other Ambulatory Visit: Payer: Self-pay

## 2018-07-15 ENCOUNTER — Ambulatory Visit (INDEPENDENT_AMBULATORY_CARE_PROVIDER_SITE_OTHER): Payer: BC Managed Care – PPO | Admitting: Emergency Medicine

## 2018-07-15 VITALS — BP 95/61 | HR 80 | Temp 98.4°F | Resp 16 | Ht 67.25 in | Wt 164.6 lb

## 2018-07-15 DIAGNOSIS — Z0000 Encounter for general adult medical examination without abnormal findings: Secondary | ICD-10-CM | POA: Diagnosis not present

## 2018-07-15 DIAGNOSIS — Z114 Encounter for screening for human immunodeficiency virus [HIV]: Secondary | ICD-10-CM

## 2018-07-15 DIAGNOSIS — Z13228 Encounter for screening for other metabolic disorders: Secondary | ICD-10-CM

## 2018-07-15 DIAGNOSIS — Z1329 Encounter for screening for other suspected endocrine disorder: Secondary | ICD-10-CM | POA: Diagnosis not present

## 2018-07-15 DIAGNOSIS — Z124 Encounter for screening for malignant neoplasm of cervix: Secondary | ICD-10-CM | POA: Diagnosis not present

## 2018-07-15 DIAGNOSIS — Z23 Encounter for immunization: Secondary | ICD-10-CM | POA: Diagnosis not present

## 2018-07-15 DIAGNOSIS — E039 Hypothyroidism, unspecified: Secondary | ICD-10-CM

## 2018-07-15 DIAGNOSIS — Z130 Encounter for screening for diseases of the blood and blood-forming organs and certain disorders involving the immune mechanism: Secondary | ICD-10-CM

## 2018-07-15 LAB — POCT URINALYSIS DIP (MANUAL ENTRY)
BILIRUBIN UA: NEGATIVE mg/dL
Bilirubin, UA: NEGATIVE
Blood, UA: NEGATIVE
Glucose, UA: NEGATIVE mg/dL
Leukocytes, UA: NEGATIVE
Nitrite, UA: NEGATIVE
PH UA: 7 (ref 5.0–8.0)
PROTEIN UA: NEGATIVE mg/dL
Urobilinogen, UA: 0.2 E.U./dL

## 2018-07-15 MED ORDER — PSEUDOEPHEDRINE HCL ER 120 MG PO TB12: 120.0000 mg | ORAL_TABLET | Freq: Every day | ORAL | 0 refills | Status: DC | PRN

## 2018-07-15 MED ORDER — LEVOTHYROXINE SODIUM 100 MCG PO TABS: 100.0000 ug | ORAL_TABLET | Freq: Every day | ORAL | 3 refills | Status: DC

## 2018-07-15 MED ORDER — MELOXICAM 15 MG PO TABS: 15.0000 mg | ORAL_TABLET | ORAL | 0 refills | Status: DC | PRN

## 2018-07-15 NOTE — Patient Instructions (Signed)

## 2018-07-15 NOTE — Addendum Note (Signed)
Addended by: Evie Lacks on: 07/15/2018 11:37 AM   Modules accepted: Orders

## 2018-07-15 NOTE — Addendum Note (Signed)
Addended by: Georg Ruddle A on: 07/15/2018 11:34 AM   Modules accepted: Orders

## 2018-07-15 NOTE — Progress Notes (Signed)
Leslie Gardner 52 y.o.   Chief Complaint  Patient presents with  . Annual Exam    WITH PAP SMEAR    HISTORY OF PRESENT ILLNESS: This is a 52 y.o. female here for her annual exam.  Has no complaints or medical concerns. Needs Pap smear today. Mammogram done last summer was normal. Past medical history includes hypothyroidism.  Taking Synthroid daily. Non-smoker.  [place] user. Thinks she had tetanus vaccine less than 10 years ago.  Needs to research this.  HPI   Prior to Admission medications   Medication Sig Start Date End Date Taking? Authorizing Provider  Calcium-Vitamin D-Vitamin K (VIACTIV PO) Take 1 each by mouth once a week.   Yes [provider]  Cyanocobalamin (B-12 PO) Take 1 tablet by mouth once a week.   Yes [provider]  fluticasone (FLONASE) 50 MCG/ACT nasal spray Place 2 sprays into both nostrils daily. 09/06/17  Yes Jaynee Eagles, PA-C  ibuprofen (ADVIL,MOTRIN) 800 MG tablet Take 1 tablet (800 mg total) by mouth every 8 (eight) hours as needed for moderate pain. 04/19/16  Yes Milton Ferguson, MD  levothyroxine (SYNTHROID, LEVOTHROID) 100 MCG tablet TAKE 1 TABLET BY MOUTH DAILY 02/25/13  Yes Hassell Done, Mary-Margaret, FNP  loratadine (CLARITIN) 10 MG tablet Take 1 tablet (10 mg total) by mouth daily. 08/23/17  Yes Jaynee Eagles, PA-C  pseudoephedrine (SUDAFED) 120 MG 12 hr tablet Take 120 mg by mouth daily as needed for congestion.   Yes [provider]    Allergies  Allergen Reactions  . Aspirin     Childhood-due to allergen testing     Patient Active Problem List   Diagnosis Date Noted  . 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 Allergy   . GERD (gastroesophageal reflux disease)   . Hypothyroidism   . Tachycardia    /diaphragram    No past surgical history on file.  Social History   Socioeconomic History  . Marital status: Married    Spouse name: Not on file  . Number of children:  Not on file  . Years of education: Not on file  . Highest education level: Not on file  Occupational History  . Not on file  Social Needs  . Financial resource strain: Not on file  . Food insecurity:    Worry: Not on file    Inability: Not on file  . Transportation needs:    Medical: Not on file    Non-medical: Not on file  Tobacco Use  . Smoking status: Former Smoker    Types: Cigarettes    Last attempt to quit: 10/15/2006    Years since quitting: 11.7  . Smokeless tobacco: Never Used  Substance and Sexual Activity  . Alcohol use: Yes    Comment: daily wine use  . Drug use: Not on file  . Sexual activity: Not on file  Lifestyle  . Physical activity:    Days per week: Not on file    Minutes per session: Not on file  . Stress: Not on file  Relationships  . Social connections:    Talks on phone: Not on file    Gets together: Not on file    Attends religious service: Not on file    Active member of club or organization: Not on file    Attends meetings of clubs or organizations: Not on file    Relationship status: Not on file  . Intimate partner violence:    Fear  of current or ex partner: Not on file    Emotionally abused: Not on file    Physically abused: Not on file    Forced sexual activity: Not on file  Other Topics Concern  . Not on file  Social History Narrative  . Not on file    Family History  Problem Relation Age of Onset  . Alzheimer's disease Mother   . Heart attack Paternal Grandfather      Review of Systems  Constitutional: Negative.  Negative for chills, fever and weight loss.  HENT: Negative.  Negative for hearing loss and sore throat.   Eyes: Negative.  Negative for blurred vision and double vision.  Respiratory: Negative.  Negative for cough and shortness of breath.   Cardiovascular: Negative.  Negative for chest pain and palpitations.  Gastrointestinal: Negative.  Negative for abdominal pain, diarrhea, heartburn, nausea and vomiting.   Genitourinary: Negative.  Negative for dysuria and hematuria.  Musculoskeletal: Negative.  Negative for back pain, myalgias and neck pain.  Skin:       Skin tags.  Family history of melanoma.  Saw dermatologist 1 year ago.  Neurological: Negative.  Negative for dizziness and headaches.  Endo/Heme/Allergies: Negative.   All other systems reviewed and are negative.   Vitals:   07/15/18 1013  BP: 95/61  Pulse: 80  Resp: 16  Temp: 98.4 F (36.9 C)  SpO2: 97%    Physical Exam  Constitutional: She appears well-developed and well-nourished.  HENT:  Head: Normocephalic and atraumatic.  Right Ear: External ear normal.  Left Ear: External ear normal.  Nose: Nose normal.  Mouth/Throat: Oropharynx is clear and moist.  Eyes: Pupils are equal, round, and reactive to light. Conjunctivae and EOM are normal.  Neck: Normal range of motion. Neck supple. No JVD present. No thyromegaly present.  Cardiovascular: Normal rate, regular rhythm, normal heart sounds and intact distal pulses.  Pulmonary/Chest: Effort normal and breath sounds normal.  Abdominal: Soft. Bowel sounds are normal. She exhibits no distension. There is no tenderness. Hernia confirmed negative in the right inguinal area and confirmed negative in the left inguinal area.  Genitourinary: Vagina normal. There is no rash, tenderness or lesion on the right labia. There is no rash, tenderness or lesion on the left labia. Cervix exhibits no motion tenderness, no discharge and no friability. Right adnexum displays no mass. Left adnexum displays no mass. No erythema, tenderness or bleeding in the vagina. No foreign body in the vagina. No signs of injury around the vagina. No vaginal discharge found.  Musculoskeletal: Normal range of motion. She exhibits no edema.  Lymphadenopathy:    She has no cervical adenopathy. No inguinal adenopathy noted on the right or left side.  Vitals reviewed.   Results for orders placed or performed in visit on  07/15/18 (from the past 24 hour(s))  POCT urinalysis dipstick     Status: Abnormal   Collection Time: 07/15/18 11:10 AM  Result Value Ref Range   Color, UA yellow yellow   Clarity, UA clear clear   Glucose, UA negative negative mg/dL   Bilirubin, UA negative negative   Ketones, POC UA negative negative mg/dL   Spec Grav, UA <=1.005 (A) 1.010 - 1.025   Blood, UA negative negative   pH, UA 7.0 5.0 - 8.0   Protein Ur, POC negative negative mg/dL   Urobilinogen, UA 0.2 0.2 or 1.0 E.U./dL   Nitrite, UA Negative Negative   Leukocytes, UA Negative Negative    ASSESSMENT & PLAN: Leslie Gardner  was seen today for annual exam.  Diagnoses and all orders for this visit:  Routine general medical examination at a health care facility -     Comprehensive metabolic panel -     Hemoglobin A1c -     Lipid panel -     POCT urinalysis dipstick  Hypothyroidism, unspecified type -     TSH -     levothyroxine (SYNTHROID, LEVOTHROID) 100 MCG tablet; Take 1 tablet (100 mcg total) by mouth daily.  Screening for HIV (human immunodeficiency virus) -     HIV antibody  Screening for endocrine, metabolic and immunity disorder -     CBC with Differential -     Comprehensive metabolic panel -     Hemoglobin A1c -     Lipid panel  Cervical cancer screening -     Cancel: Pap IG and Chlamydia/Gonococcus, NAA -     Pap IG and HPV (high risk) DNA detection   Patient Instructions   Health Maintenance, Female Adopting a healthy lifestyle and getting preventive care can go a long way to promote health and wellness. Talk with your health care provider about what schedule of regular examinations is right for you. This is a good chance for you to check in with your provider about disease prevention and staying healthy. In between checkups, there are plenty of things you can do on your own. Experts have done a lot of research about which lifestyle changes and preventive measures are most likely to keep you healthy. Ask  your health care provider for more information. Weight and diet Eat a healthy diet  Be sure to include plenty of vegetables, fruits, low-fat dairy products, and lean protein.  Do not eat a lot of foods high in solid fats, added sugars, or salt.  Get regular exercise. This is one of the most important things you can do for your health. ? Most adults should exercise for at least 150 minutes each week. The exercise should increase your heart rate and make you sweat (moderate-intensity exercise). ? Most adults should also do strengthening exercises at least twice a week. This is in addition to the moderate-intensity exercise.  Maintain a healthy weight  Body mass index (BMI) is a measurement that can be used to identify possible weight problems. It estimates body fat based on height and weight. Your health care provider can help determine your BMI and help you achieve or maintain a healthy weight.  For females 36 years of age and older: ? A BMI below 18.5 is considered underweight. ? A BMI of 18.5 to 24.9 is normal. ? A BMI of 25 to 29.9 is considered overweight. ? A BMI of 30 and above is considered obese.  Watch levels of cholesterol and blood lipids  You should start having your blood tested for lipids and cholesterol at 53 years of age, then have this test every 5 years.  You may need to have your cholesterol levels checked more often if: ? Your lipid or cholesterol levels are high. ? You are older than 53 years of age. ? You are at high risk for heart disease.  Cancer screening Lung Cancer  Lung cancer screening is recommended for adults 24-33 years old who are at high risk for lung cancer because of a history of smoking.  A yearly low-dose CT scan of the lungs is recommended for people who: ? Currently smoke. ? Have quit within the past 15 years. ? Have at least a 30-pack-year history  of smoking. A pack year is smoking an average of one pack of cigarettes a day for 1  year.  Yearly screening should continue until it has been 15 years since you quit.  Yearly screening should stop if you develop a health problem that would prevent you from having lung cancer treatment.  Breast Cancer  Practice breast self-awareness. This means understanding how your breasts normally appear and feel.  It also means doing regular breast self-exams. Let your health care provider know about any changes, no matter how small.  If you are in your 20s or 30s, you should have a clinical breast exam (CBE) by a health care provider every 1-3 years as part of a regular health exam.  If you are 25 or older, have a CBE every year. Also consider having a breast X-ray (mammogram) every year.  If you have a family history of breast cancer, talk to your health care provider about genetic screening.  If you are at high risk for breast cancer, talk to your health care provider about having an MRI and a mammogram every year.  Breast cancer gene (BRCA) assessment is recommended for women who have family members with BRCA-related cancers. BRCA-related cancers include: ? Breast. ? Ovarian. ? Tubal. ? Peritoneal cancers.  Results of the assessment will determine the need for genetic counseling and BRCA1 and BRCA2 testing.  Cervical Cancer Your health care provider may recommend that you be screened regularly for cancer of the pelvic organs (ovaries, uterus, and vagina). This screening involves a pelvic examination, including checking for microscopic changes to the surface of your cervix (Pap test). You may be encouraged to have this screening done every 3 years, beginning at age 15.  For women ages 85-65, health care providers may recommend pelvic exams and Pap testing every 3 years, or they may recommend the Pap and pelvic exam, combined with testing for human papilloma virus (HPV), every 5 years. Some types of HPV increase your risk of cervical cancer. Testing for HPV may also be done on  women of any age with unclear Pap test results.  Other health care providers may not recommend any screening for nonpregnant women who are considered low risk for pelvic cancer and who do not have symptoms. Ask your health care provider if a screening pelvic exam is right for you.  If you have had past treatment for cervical cancer or a condition that could lead to cancer, you need Pap tests and screening for cancer for at least 20 years after your treatment. If Pap tests have been discontinued, your risk factors (such as having a new sexual partner) need to be reassessed to determine if screening should resume. Some women have medical problems that increase the chance of getting cervical cancer. In these cases, your health care provider may recommend more frequent screening and Pap tests.  Colorectal Cancer  This type of cancer can be detected and often prevented.  Routine colorectal cancer screening usually begins at 53 years of age and continues through 53 years of age.  Your health care provider may recommend screening at an earlier age if you have risk factors for colon cancer.  Your health care provider may also recommend using home test kits to check for hidden blood in the stool.  A small camera at the end of a tube can be used to examine your colon directly (sigmoidoscopy or colonoscopy). This is done to check for the earliest forms of colorectal cancer.  Routine screening usually begins  at age 57.  Direct examination of the colon should be repeated every 5-10 years through 53 years of age. However, you may need to be screened more often if early forms of precancerous polyps or small growths are found.  Skin Cancer  Check your skin from head to toe regularly.  Tell your health care provider about any new moles or changes in moles, especially if there is a change in a mole's shape or color.  Also tell your health care provider if you have a mole that is larger than the size of a  pencil eraser.  Always use sunscreen. Apply sunscreen liberally and repeatedly throughout the day.  Protect yourself by wearing long sleeves, pants, a wide-brimmed hat, and sunglasses whenever you are outside.  Heart disease, diabetes, and high blood pressure  High blood pressure causes heart disease and increases the risk of stroke. High blood pressure is more likely to develop in: ? People who have blood pressure in the high end of the normal range (130-139/85-89 mm Hg). ? People who are overweight or obese. ? People who are African American.  If you are 48-73 years of age, have your blood pressure checked every 3-5 years. If you are 41 years of age or older, have your blood pressure checked every year. You should have your blood pressure measured twice-once when you are at a hospital or clinic, and once when you are not at a hospital or clinic. Record the average of the two measurements. To check your blood pressure when you are not at a hospital or clinic, you can use: ? An automated blood pressure machine at a pharmacy. ? A home blood pressure monitor.  If you are between 21 years and 73 years old, ask your health care provider if you should take aspirin to prevent strokes.  Have regular diabetes screenings. This involves taking a blood sample to check your fasting blood sugar level. ? If you are at a normal weight and have a low risk for diabetes, have this test once every three years after 53 years of age. ? If you are overweight and have a high risk for diabetes, consider being tested at a younger age or more often. Preventing infection Hepatitis B  If you have a higher risk for hepatitis B, you should be screened for this virus. You are considered at high risk for hepatitis B if: ? You were born in a country where hepatitis B is common. Ask your health care provider which countries are considered high risk. ? Your parents were born in a high-risk country, and you have not been  immunized against hepatitis B (hepatitis B vaccine). ? You have HIV or AIDS. ? You use needles to inject street drugs. ? You live with someone who has hepatitis B. ? You have had sex with someone who has hepatitis B. ? You get hemodialysis treatment. ? You take certain medicines for conditions, including cancer, organ transplantation, and autoimmune conditions.  Hepatitis C  Blood testing is recommended for: ? Everyone born from [birth year] through 1966. ? Anyone with known risk factors for hepatitis C.  Sexually transmitted infections (STIs)  You should be screened for sexually transmitted infections (STIs) including gonorrhea and chlamydia if: ? You are sexually active and are younger than 53 years of age. ? You are older than 53 years of age and your health care provider tells you that you are at risk for this type of infection. ? Your sexual activity has changed since you  were last screened and you are at an increased risk for chlamydia or gonorrhea. Ask your health care provider if you are at risk.  If you do not have HIV, but are at risk, it may be recommended that you take a prescription medicine daily to prevent HIV infection. This is called pre-exposure prophylaxis (PrEP). You are considered at risk if: ? You are sexually active and do not regularly use condoms or know the HIV status of your partner(s). ? You take drugs by injection. ? You are sexually active with a partner who has HIV.  Talk with your health care provider about whether you are at high risk of being infected with HIV. If you choose to begin PrEP, you should first be tested for HIV. You should then be tested every 3 months for as long as you are taking PrEP. Pregnancy  If you are premenopausal and you may become pregnant, ask your health care provider about preconception counseling.  If you may become pregnant, take 400 to 800 micrograms (mcg) of folic acid every day.  If you want to prevent pregnancy, talk to your  health care provider about birth control (contraception). Osteoporosis and menopause  Osteoporosis is a disease in which the bones lose minerals and strength with aging. This can result in serious bone fractures. Your risk for osteoporosis can be identified using a bone density scan.  If you are 62 years of age or older, or if you are at risk for osteoporosis and fractures, ask your health care provider if you should be screened.  Ask your health care provider whether you should take a calcium or vitamin D supplement to lower your risk for osteoporosis.  Menopause may have certain physical symptoms and risks.  Hormone replacement therapy may reduce some of these symptoms and risks. Talk to your health care provider about whether hormone replacement therapy is right for you. Follow these instructions at home:  Schedule regular health, dental, and eye exams.  Stay current with your immunizations.  Do not use any tobacco products including cigarettes, chewing tobacco, or electronic cigarettes.  If you are pregnant, do not drink alcohol.  If you are breastfeeding, limit how much and how often you drink alcohol.  Limit alcohol intake to no more than 1 drink per day for nonpregnant women. One drink equals 12 ounces of beer, 5 ounces of wine, or 1 ounces of hard liquor.  Do not use street drugs.  Do not share needles.  Ask your health care provider for help if you need support or information about quitting drugs.  Tell your health care provider if you often feel depressed.  Tell your health care provider if you have ever been abused or do not feel safe at home. This information is not intended to replace advice given to you by your health care provider. Make sure you discuss any questions you have with your health care provider. Document Released: 03/20/2011 Document Revised: 02/10/2016 Document Reviewed: 06/08/2015 Elsevier Interactive Patient Education  2018 Elsevier  Inc.      Agustina Caroli, MD [facility]

## 2018-07-16 LAB — COMPREHENSIVE METABOLIC PANEL
ALBUMIN: 4.4 g/dL (ref 3.5–5.5)
ALT: 13 IU/L (ref 0–32)
AST: 17 IU/L (ref 0–40)
Albumin/Globulin Ratio: 2 (ref 1.2–2.2)
Alkaline Phosphatase: 62 IU/L (ref 39–117)
BILIRUBIN TOTAL: 0.6 mg/dL (ref 0.0–1.2)
BUN / CREAT RATIO: 16 (ref 9–23)
BUN: 12 mg/dL (ref 6–24)
CHLORIDE: 105 mmol/L (ref 96–106)
CO2: 24 mmol/L (ref 20–29)
Calcium: 9.5 mg/dL (ref 8.7–10.2)
Creatinine, Ser: 0.75 mg/dL (ref 0.57–1.00)
GFR calc non Af Amer: 91 mL/min/{1.73_m2} (ref >59)
GFR, EST AFRICAN AMERICAN: 105 mL/min/{1.73_m2} (ref >59)
Globulin, Total: 2.2 g/dL (ref 1.5–4.5)
Glucose: 92 mg/dL (ref 65–99)
Potassium: 4.2 mmol/L (ref 3.5–5.2)
Sodium: 143 mmol/L (ref 134–144)
TOTAL PROTEIN: 6.6 g/dL (ref 6.0–8.5)

## 2018-07-16 LAB — CBC WITH DIFFERENTIAL/PLATELET
BASOS: 1 %
Basophils Absolute: 0 10*3/uL (ref 0.0–0.2)
EOS (ABSOLUTE): 0.1 10*3/uL (ref 0.0–0.4)
Eos: 3 %
Hematocrit: 41.3 % (ref 34.0–46.6)
Hemoglobin: 13.8 g/dL (ref 11.1–15.9)
IMMATURE GRANS (ABS): 0 10*3/uL (ref 0.0–0.1)
Immature Granulocytes: 0 %
LYMPHS: 33 %
Lymphocytes Absolute: 1.3 10*3/uL (ref 0.7–3.1)
MCH: 30.9 pg (ref 26.6–33.0)
MCHC: 33.4 g/dL (ref 31.5–35.7)
MCV: 92 fL (ref 79–97)
MONOS ABS: 0.5 10*3/uL (ref 0.1–0.9)
Monocytes: 11 %
Neutrophils Absolute: 2.1 10*3/uL (ref 1.4–7.0)
Neutrophils: 52 %
PLATELETS: 221 10*3/uL (ref 150–450)
RBC: 4.47 x10E6/uL (ref 3.77–5.28)
RDW: 12.4 % (ref 12.3–15.4)
WBC: 4 10*3/uL (ref 3.4–10.8)

## 2018-07-16 LAB — HIV ANTIBODY (ROUTINE TESTING W REFLEX): HIV SCREEN 4TH GENERATION: NONREACTIVE

## 2018-07-16 LAB — HEMOGLOBIN A1C
Est. average glucose Bld gHb Est-mCnc: 108 mg/dL
Hgb A1c MFr Bld: 5.4 % (ref 4.8–5.6)

## 2018-07-16 LAB — LIPID PANEL
CHOL/HDL RATIO: 2.4 ratio (ref 0.0–4.4)
Cholesterol, Total: 170 mg/dL (ref 100–199)
HDL: 72 mg/dL (ref >39)
LDL CALC: 89 mg/dL (ref 0–99)
Triglycerides: 43 mg/dL (ref 0–149)
VLDL Cholesterol Cal: 9 mg/dL (ref 5–40)

## 2018-07-16 LAB — TSH: TSH: 0.379 u[IU]/mL — ABNORMAL LOW (ref 0.450–4.500)

## 2018-07-19 LAB — PAP IG AND HPV HIGH-RISK

## 2018-07-19 LAB — HPV, LOW VOLUME (REFLEX): HPV low volume reflex: NEGATIVE

## 2018-07-20 ENCOUNTER — Encounter: Payer: Self-pay | Admitting: Radiology

## 2018-09-01 ENCOUNTER — Other Ambulatory Visit: Payer: Self-pay | Admitting: Emergency Medicine

## 2018-09-02 NOTE — Telephone Encounter (Signed)
Requested medication (s) are due for refill today: Yes  Requested medication (s) are on the active medication list: Yes  Last refill:  07/15/18  Future visit scheduled: No  Notes to clinic:  See pharmacy request.    Requested Prescriptions  Pending Prescriptions Disp Refills   QC SUPHEDRINE MAXIMUM STRENGTH 120 MG 12 hr tablet [Pharmacy Med Name: QC SUPHEDRINE 12HR 120 MG C] 20 tablet 0    Sig: TAKE 1 TABLET EVERY 12 HOURS AS NEEDED FOR CONGESTION.     Not Delegated - Ear, Nose, and Throat:  Decongestants - Pseudoephedrine Failed - 09/01/2018 12:23 PM      Failed - This refill cannot be delegated      Passed - Last BP in normal range    BP Readings from Last 1 Encounters:  07/15/18 95/61         Passed - Valid encounter within last 12 months    Recent Outpatient Visits          1 month ago Routine general medical examination at a health care facility   [facility] Eilleen KempfMiguel Jose, [place]   5 months ago Finger pain, right   [facility] [place]   12 months ago Cough   [facility] [place]   1 year ago Viral URI with cough   [facility] [place]   1 year ago Viral URI with cough   [facility] [place]

## 2018-09-05 NOTE — Telephone Encounter (Signed)
Please advise 

## 2018-12-21 IMAGING — MG DIGITAL SCREENING BILATERAL MAMMOGRAM WITH CAD
4 series · 4 of 4 positions shown · non-contrast
Comparison: Previous exam(s).

CLINICAL DATA: Screening.

EXAM:
DIGITAL SCREENING BILATERAL MAMMOGRAM WITH CAD

[R MLO]
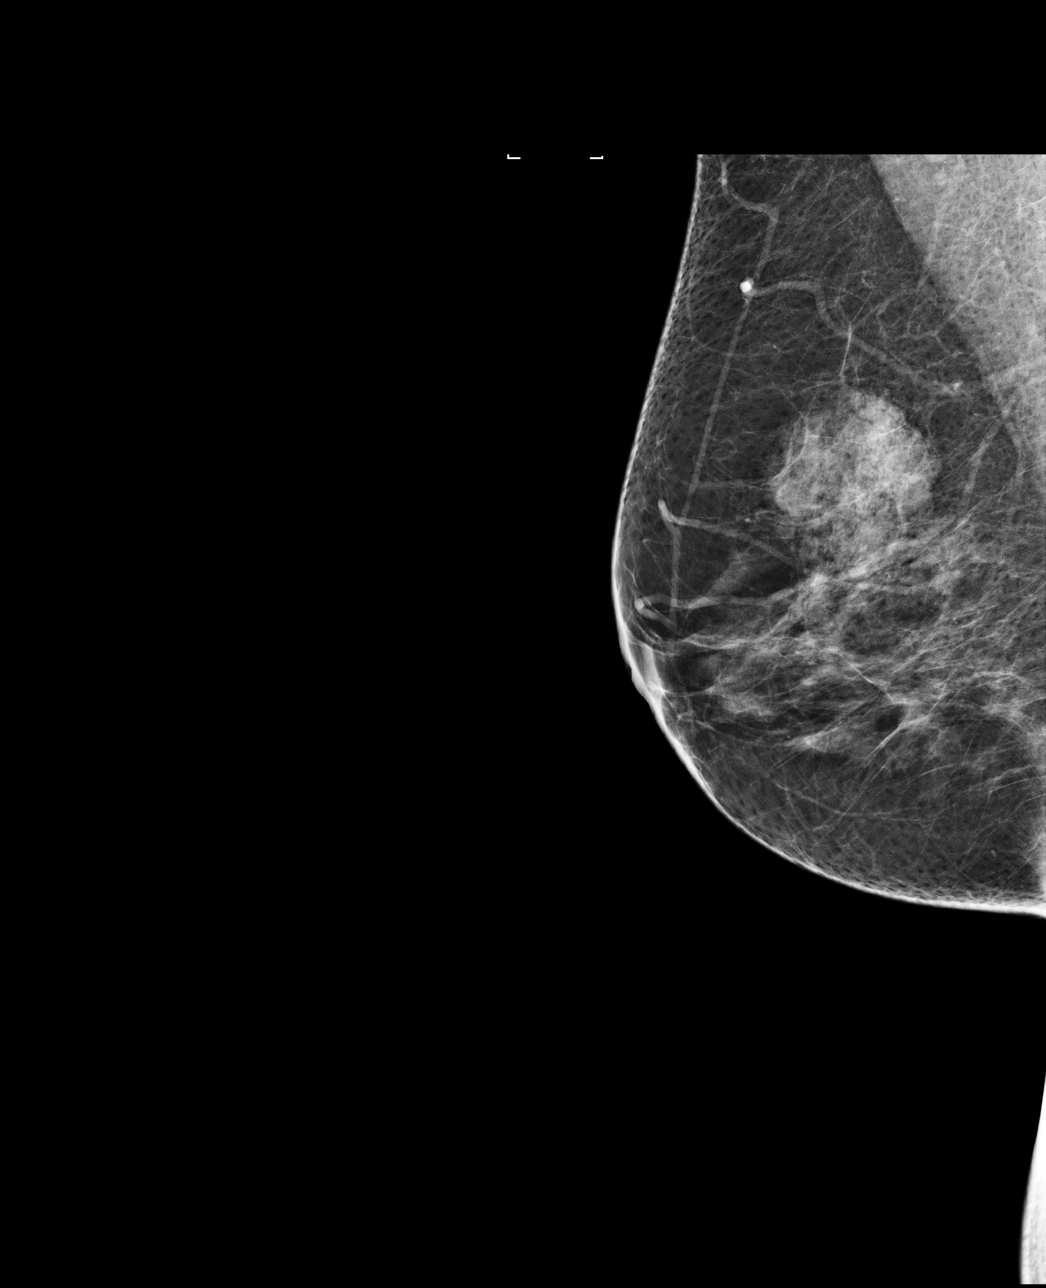

[L CC]
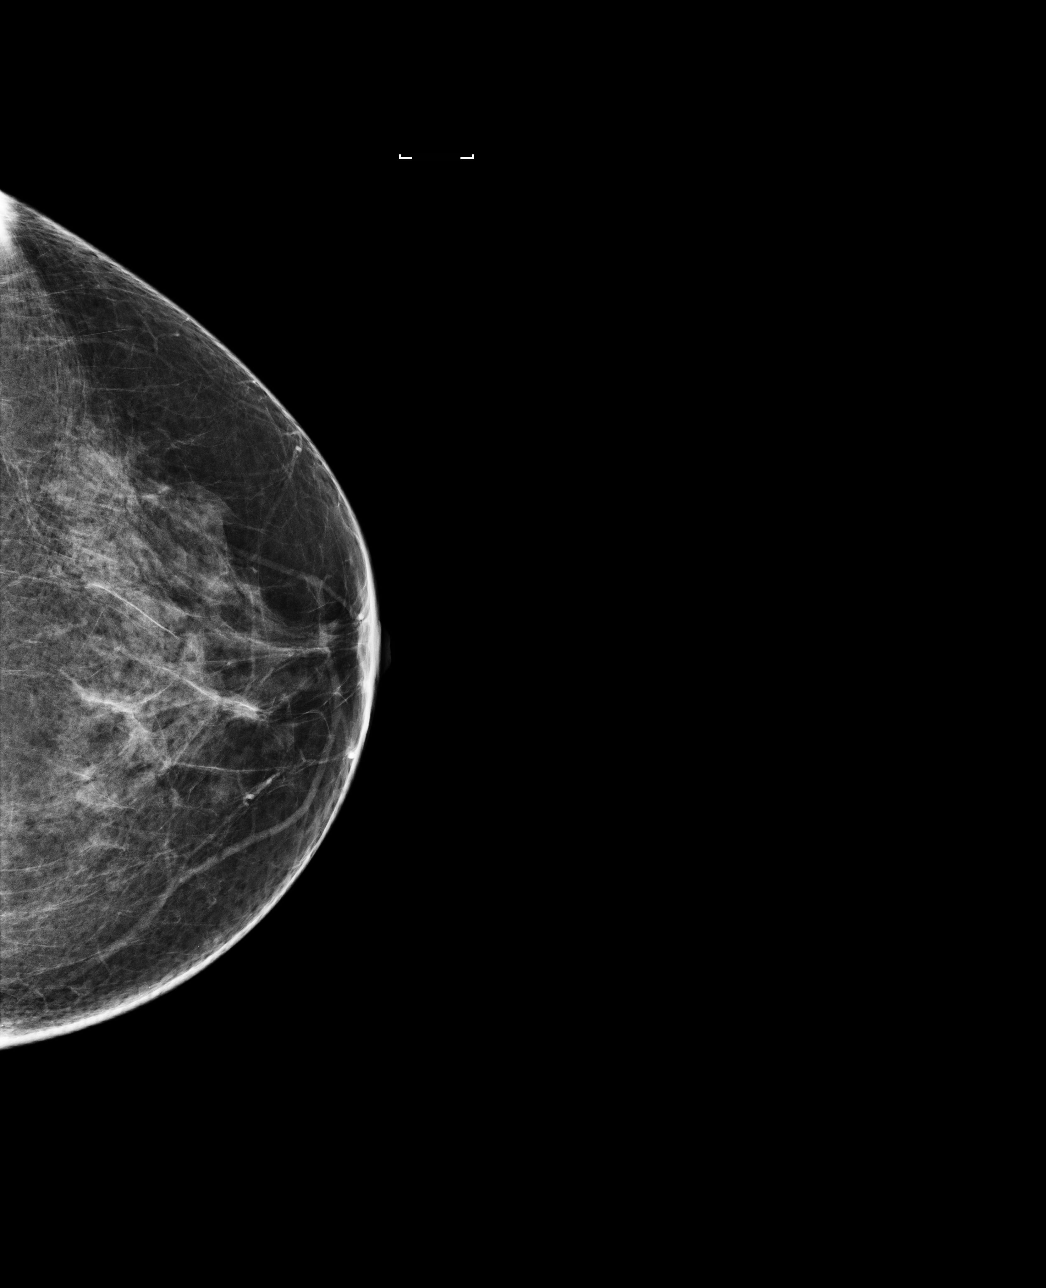

[R CC]
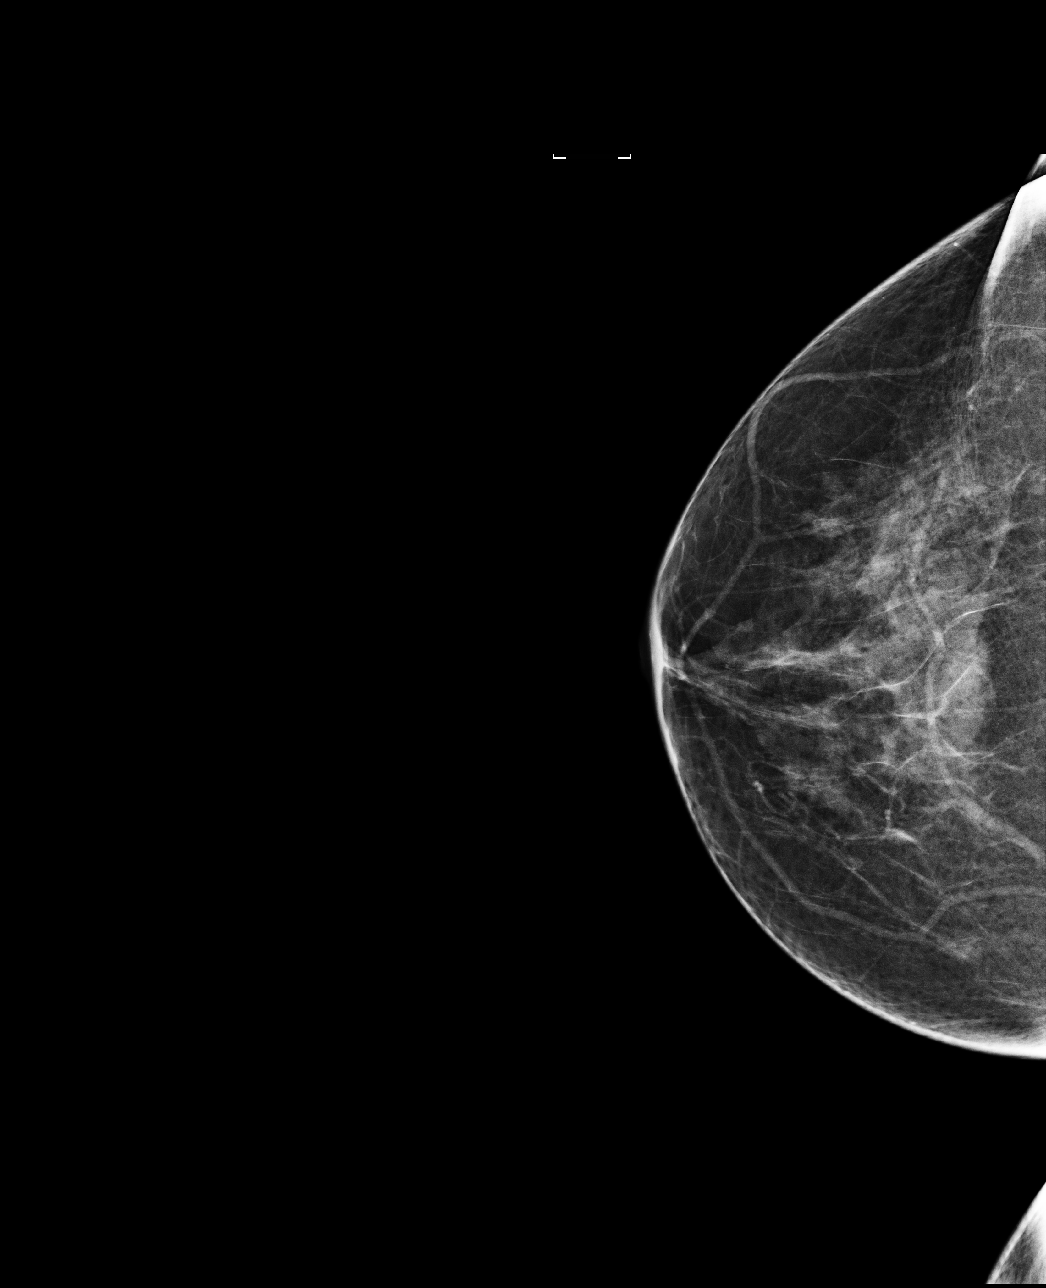

[L MLO]
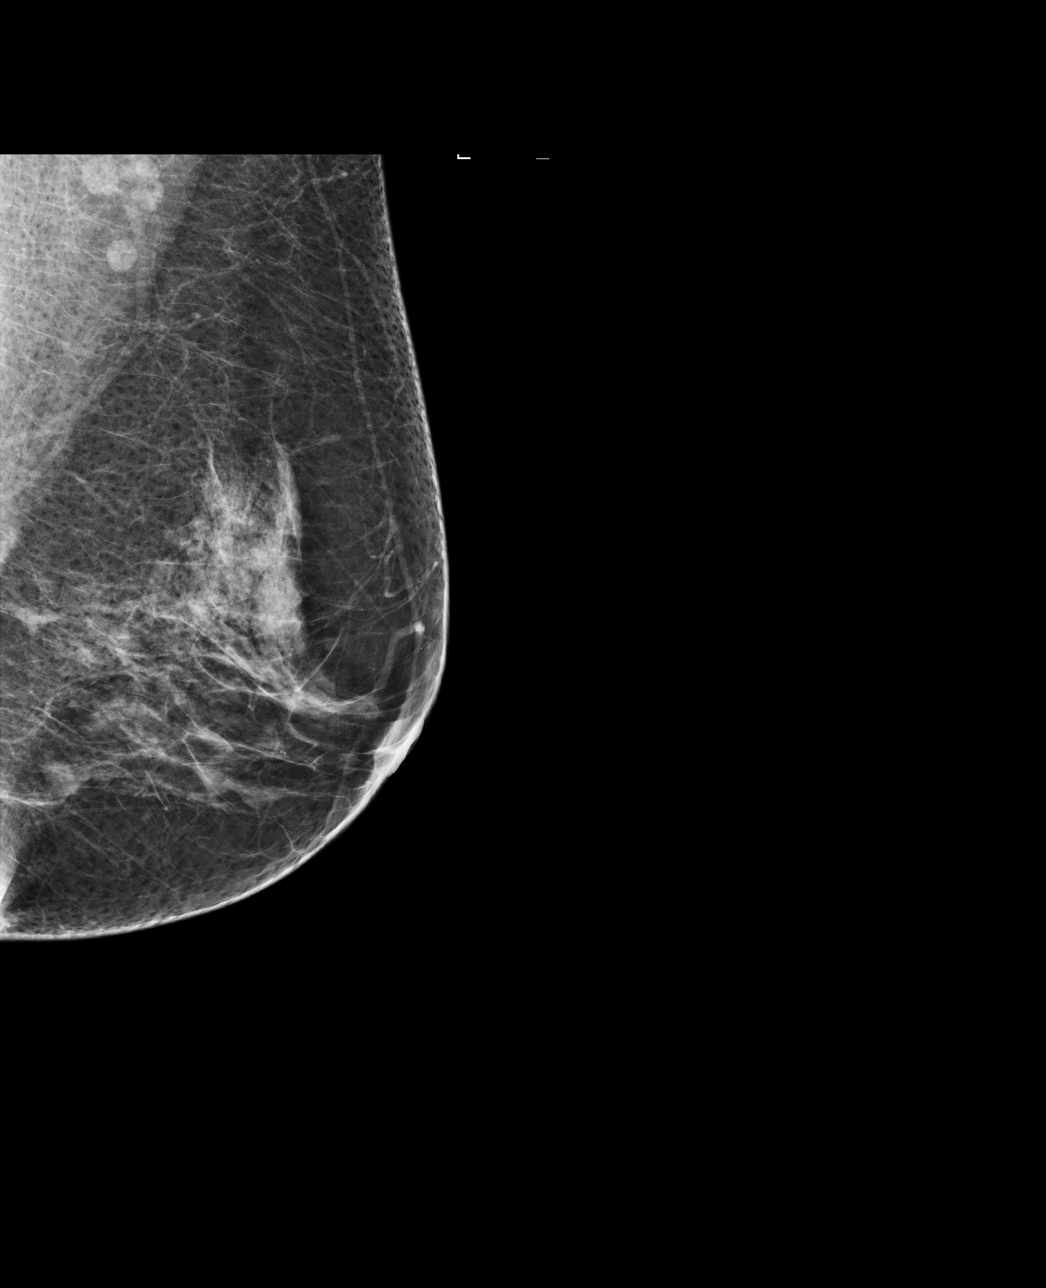

[4 of 4 positions shown; findings below may reference images not displayed]

ACR Breast Density Category c: The breast tissue is heterogeneously
dense, which may obscure small masses.
FINDINGS: There are no findings suspicious for malignancy. Images were
processed with CAD.
IMPRESSION: No mammographic evidence of malignancy. A result letter of this
screening mammogram will be mailed directly to the patient.

RECOMMENDATION:
Screening mammogram in one year. (Code:[id number])

BI-RADS CATEGORY  1: Negative.

## 2019-03-28 ENCOUNTER — Ambulatory Visit: Payer: Self-pay

## 2019-03-28 NOTE — Telephone Encounter (Signed)
Pt has appointment scheduled

## 2019-03-28 NOTE — Telephone Encounter (Signed)
Patient called and says late last night, she developed a fever of 99-100. She says she has a sore throat, headache, body aches and abdominal pain under the rib cage. She says that she called around for covid testing and was told she will need a doctor referral, so she called the office. I advised she will need a virtual visit, then he will send the referral and a nurse will call to schedule. She denies any travel, no cough, no SOB. I called the office and spoke to Rayne, [facility] who asked to speak to the patient, the call was connected.  Answer Assessment - Initial Assessment Questions 1. TEMPERATURE: "What is the most recent temperature?"  "How was it measured?"      99-100 2. ONSET: "When did the fever start?"      Last night 3. SYMPTOMS: "Do you have any other symptoms besides the fever?"  (e.g., colds, headache, sore throat, earache, cough, rash, diarrhea, vomiting, abdominal pain)     Abdominal pain, headache, body aches, sore throat 4. CAUSE: If there are no symptoms, ask: "What do you think is causing the fever?"      I don't know 5. CONTACTS: "Does anyone else in the family have an infection?"     No 6. TREATMENT: "What have you done so far to treat this fever?" (e.g., medications)     Nothing 7. IMMUNOCOMPROMISE: "Do you have of the following: diabetes, HIV positive, splenectomy, cancer chemotherapy, chronic steroid treatment, transplant patient, etc."     No 8. PREGNANCY: "Is there any chance you are pregnant?" "When was your last menstrual period?"     No 9. TRAVEL: "Have you traveled out of the country in the last month?" (e.g., travel history, exposures)     No travel or exposrue  Protocols used: FEVER-A-AH

## 2019-03-31 ENCOUNTER — Other Ambulatory Visit: Payer: Self-pay

## 2019-03-31 ENCOUNTER — Telehealth: Payer: BC Managed Care – PPO | Admitting: Emergency Medicine

## 2019-06-27 ENCOUNTER — Telehealth: Payer: Self-pay | Admitting: Emergency Medicine

## 2019-07-09 ENCOUNTER — Ambulatory Visit (INDEPENDENT_AMBULATORY_CARE_PROVIDER_SITE_OTHER): Payer: BC Managed Care – PPO | Admitting: Emergency Medicine

## 2019-07-09 ENCOUNTER — Encounter: Payer: Self-pay | Admitting: Emergency Medicine

## 2019-07-09 ENCOUNTER — Other Ambulatory Visit: Payer: Self-pay

## 2019-07-09 VITALS — BP 108/72 | HR 66 | Temp 98.1°F | Resp 16 | Ht 67.25 in | Wt 163.4 lb

## 2019-07-09 DIAGNOSIS — Z1329 Encounter for screening for other suspected endocrine disorder: Secondary | ICD-10-CM

## 2019-07-09 DIAGNOSIS — Z1231 Encounter for screening mammogram for malignant neoplasm of breast: Secondary | ICD-10-CM | POA: Diagnosis not present

## 2019-07-09 DIAGNOSIS — E039 Hypothyroidism, unspecified: Secondary | ICD-10-CM | POA: Diagnosis not present

## 2019-07-09 DIAGNOSIS — Z130 Encounter for screening for diseases of the blood and blood-forming organs and certain disorders involving the immune mechanism: Secondary | ICD-10-CM | POA: Diagnosis not present

## 2019-07-09 DIAGNOSIS — Z13228 Encounter for screening for other metabolic disorders: Secondary | ICD-10-CM

## 2019-07-09 DIAGNOSIS — Z13220 Encounter for screening for lipoid disorders: Secondary | ICD-10-CM

## 2019-07-09 DIAGNOSIS — Z0000 Encounter for general adult medical examination without abnormal findings: Secondary | ICD-10-CM

## 2019-07-09 DIAGNOSIS — Z23 Encounter for immunization: Secondary | ICD-10-CM

## 2019-07-09 DIAGNOSIS — Z0001 Encounter for general adult medical examination with abnormal findings: Secondary | ICD-10-CM | POA: Diagnosis not present

## 2019-07-09 LAB — POCT URINALYSIS DIP (MANUAL ENTRY)
Bilirubin, UA: NEGATIVE
Blood, UA: NEGATIVE
Glucose, UA: NEGATIVE mg/dL
Ketones, POC UA: NEGATIVE mg/dL
Leukocytes, UA: NEGATIVE
Nitrite, UA: NEGATIVE
Protein Ur, POC: NEGATIVE mg/dL
Spec Grav, UA: 1.01 (ref 1.010–1.025)
Urobilinogen, UA: 0.2 E.U./dL
pH, UA: 6 (ref 5.0–8.0)

## 2019-07-09 MED ORDER — MELOXICAM 15 MG PO TABS: 15.0000 mg | ORAL_TABLET | ORAL | 0 refills | Status: AC | PRN

## 2019-07-09 MED ORDER — LEVOTHYROXINE SODIUM 100 MCG PO TABS: 100.0000 ug | ORAL_TABLET | Freq: Every day | ORAL | 3 refills | Status: AC

## 2019-07-09 NOTE — Patient Instructions (Addendum)
   If you have lab work done today you will be contacted with your lab results within the next 2 weeks.  If you have not heard from us then please contact us. The fastest way to get your results is to register for My Chart.   IF you received an x-ray today, you will receive an invoice from [facility]. Please contact [facility] at [phone] with questions or concerns regarding your invoice.   IF you received labwork today, you will receive an invoice from [facility]. Please contact [facility] at [phone] with questions or concerns regarding your invoice.   Our billing staff will not be able to assist you with questions regarding bills from these companies.  You will be contacted with the lab results as soon as they are available. The fastest way to get your results is to activate your My Chart account. Instructions are located on the last page of this paperwork. If you have not heard from us regarding the results in 2 weeks, please contact this office.      Health Maintenance, Female Adopting a healthy lifestyle and getting preventive care are important in promoting health and wellness. Ask your health care provider about:  The right schedule for you to have regular tests and exams.  Things you can do on your own to prevent diseases and keep yourself healthy. What should I know about diet, weight, and exercise? Eat a healthy diet   Eat a diet that includes plenty of vegetables, fruits, low-fat dairy products, and lean protein.  Do not eat a lot of foods that are high in solid fats, added sugars, or sodium. Maintain a healthy weight Body mass index (BMI) is used to identify weight problems. It estimates body fat based on height and weight. Your health care provider can help determine your BMI and help you achieve or maintain a healthy weight. Get regular exercise Get regular exercise. This is one of the most important things you can do for your health. Most  adults should:  Exercise for at least 150 minutes each week. The exercise should increase your heart rate and make you sweat (moderate-intensity exercise).  Do strengthening exercises at least twice a week. This is in addition to the moderate-intensity exercise.  Spend less time sitting. Even light physical activity can be beneficial. Watch cholesterol and blood lipids Have your blood tested for lipids and cholesterol at 54 years of age, then have this test every 5 years. Have your cholesterol levels checked more often if:  Your lipid or cholesterol levels are high.  You are older than 54 years of age.  You are at high risk for heart disease. What should I know about cancer screening? Depending on your health history and family history, you may need to have cancer screening at various ages. This may include screening for:  Breast cancer.  Cervical cancer.  Colorectal cancer.  Skin cancer.  Lung cancer. What should I know about heart disease, diabetes, and high blood pressure? Blood pressure and heart disease  High blood pressure causes heart disease and increases the risk of stroke. This is more likely to develop in people who have high blood pressure readings, are of African descent, or are overweight.  Have your blood pressure checked: ? Every 3-5 years if you are 18-39 years of age. ? Every year if you are 40 years old or older. Diabetes Have regular diabetes screenings. This checks your fasting blood sugar level. Have the screening done:  Once every   three years after age 40 if you are at a normal weight and have a low risk for diabetes.  More often and at a younger age if you are overweight or have a high risk for diabetes. What should I know about preventing infection? Hepatitis B If you have a higher risk for hepatitis B, you should be screened for this virus. Talk with your health care provider to find out if you are at risk for hepatitis B infection. Hepatitis  C Testing is recommended for:  Everyone born from 1945 through 1965.  Anyone with known risk factors for hepatitis C. Sexually transmitted infections (STIs)  Get screened for STIs, including gonorrhea and chlamydia, if: ? You are sexually active and are younger than 54 years of age. ? You are older than 54 years of age and your health care provider tells you that you are at risk for this type of infection. ? Your sexual activity has changed since you were last screened, and you are at increased risk for chlamydia or gonorrhea. Ask your health care provider if you are at risk.  Ask your health care provider about whether you are at high risk for HIV. Your health care provider may recommend a prescription medicine to help prevent HIV infection. If you choose to take medicine to prevent HIV, you should first get tested for HIV. You should then be tested every 3 months for as long as you are taking the medicine. Pregnancy  If you are about to stop having your period (premenopausal) and you may become pregnant, seek counseling before you get pregnant.  Take 400 to 800 micrograms (mcg) of folic acid every day if you become pregnant.  Ask for birth control (contraception) if you want to prevent pregnancy. Osteoporosis and menopause Osteoporosis is a disease in which the bones lose minerals and strength with aging. This can result in bone fractures. If you are 65 years old or older, or if you are at risk for osteoporosis and fractures, ask your health care provider if you should:  Be screened for bone loss.  Take a calcium or vitamin D supplement to lower your risk of fractures.  Be given hormone replacement therapy (HRT) to treat symptoms of menopause. Follow these instructions at home: Lifestyle  Do not use any products that contain nicotine or tobacco, such as cigarettes, e-cigarettes, and chewing tobacco. If you need help quitting, ask your health care provider.  Do not use street  drugs.  Do not share needles.  Ask your health care provider for help if you need support or information about quitting drugs. Alcohol use  Do not drink alcohol if: ? Your health care provider tells you not to drink. ? You are pregnant, may be pregnant, or are planning to become pregnant.  If you drink alcohol: ? Limit how much you use to 0-1 drink a day. ? Limit intake if you are breastfeeding.  Be aware of how much alcohol is in your drink. In the [place] one drink equals one 12 oz bottle of beer (355 mL), one 5 oz glass of wine (148 mL), or one 1 oz glass of hard liquor (44 mL). General instructions  Schedule regular health, dental, and eye exams.  Stay current with your vaccines.  Tell your health care provider if: ? You often feel depressed. ? You have ever been abused or do not feel safe at home. Summary  Adopting a healthy lifestyle and getting preventive care are important in promoting health and wellness.    Follow your health care provider's instructions about healthy diet, exercising, and getting tested or screened for diseases.  Follow your health care provider's instructions on monitoring your cholesterol and blood pressure. This information is not intended to replace advice given to you by your health care provider. Make sure you discuss any questions you have with your health care provider. Document Released: 03/20/2011 Document Revised: 08/28/2018 Document Reviewed: 08/28/2018 Elsevier Patient Education  2020 Elsevier Inc.  

## 2019-07-09 NOTE — Progress Notes (Signed)
Leslie Gardner 53 y.o.   Chief Complaint  Patient presents with  . Annual Exam    not due for pap smear until 2022  . Medication Refill    PEND and patient wants a Shingrix Rx sent to pharmacy  . Dizziness    per patient    HISTORY OF PRESENT ILLNESS:  This is a 53 y.o. female here for her annual exam. Has a history of hypothyroidism, taking Synthroid 100 mcg/day.  Doing well.  No complaints. Otherwise healthy female with a healthy lifestyle. Also occasional episodes of tachycardia and has seen a cardiologist in the past.  Most likely physiological. Has occasional low back pain. Otherwise no complaints or medical concerns today. Health maintenance reviewed with patient.  Needs mammogram. Requesting Shingrix vaccine. Up-to-date with colonoscopy and Pap smear.  HPI   Prior to Admission medications   Medication Sig Start Date End Date Taking? Authorizing Provider  Cyanocobalamin (B-12 PO) Take 1 tablet by mouth once a week.   Yes [provider]  fluticasone (FLONASE) 50 MCG/ACT nasal spray Place 2 sprays into both nostrils daily. 09/06/17  Yes [name], PA-C  ibuprofen (ADVIL,MOTRIN) 800 MG tablet Take 1 tablet (800 mg total) by mouth every 8 (eight) hours as needed for moderate pain. 04/19/16  Yes Bethann Berkshire, MD  levothyroxine (SYNTHROID) 100 MCG tablet Take 1 tablet (100 mcg total) by mouth daily. 07/09/19  Yes Haset Oaxaca, Eilleen Kempf, MD  loratadine (CLARITIN) 10 MG tablet Take 1 tablet (10 mg total) by mouth daily. 08/23/17  Yes [name], PA-C  meloxicam (MOBIC) 15 MG tablet Take 1 tablet (15 mg total) by mouth as needed for pain. 07/09/19  Yes Somer Trotter, Eilleen Kempf, MD  Multiple Vitamins-Minerals (WOMENS 50+ MULTI VITAMIN/MIN) TABS Take by mouth daily.   Yes [provider]  QC SUPHEDRINE MAXIMUM STRENGTH 120 MG 12 hr tablet TAKE 1 TABLET EVERY 12 HOURS AS NEEDED FOR CONGESTION. 09/05/18  Yes Aleni Andrus, Eilleen Kempf, MD  Calcium-Vitamin D-Vitamin K  (VIACTIV PO) Take 1 each by mouth once a week.    [provider]    Allergies  Allergen Reactions  . Aspirin     Childhood-due to allergen testing     Patient Active Problem List   Diagnosis Date Noted  . Hypothyroidism 07/09/2019    Past Medical History:  Diagnosis Date  . Allergy   . GERD (gastroesophageal reflux disease)   . Hypothyroidism   . Tachycardia    /diaphragram    History reviewed. No pertinent surgical history.  Social History   Socioeconomic History  . Marital status: Married    Spouse name: Not on file  . Number of children: Not on file  . Years of education: Not on file  . Highest education level: Not on file  Occupational History  . Not on file  Social Needs  . Financial resource strain: Not on file  . Food insecurity    Worry: Not on file    Inability: Not on file  . Transportation needs    Medical: Not on file    Non-medical: Not on file  Tobacco Use  . Smoking status: Former Smoker    Types: Cigarettes    Quit date: 10/15/2006    Years since quitting: 12.7  . Smokeless tobacco: Never Used  Substance and Sexual Activity  . Alcohol use: Yes    Comment: daily wine use  . Drug use: Not on file  . Sexual activity: Not on file  Lifestyle  .  Physical activity    Days per week: Not on file    Minutes per session: Not on file  . Stress: Not on file  Relationships  . Social Musician on phone: Not on file    Gets together: Not on file    Attends religious service: Not on file    Active member of club or organization: Not on file    Attends meetings of clubs or organizations: Not on file    Relationship status: Not on file  . Intimate partner violence    Fear of current or ex partner: Not on file    Emotionally abused: Not on file    Physically abused: Not on file    Forced sexual activity: Not on file  Other Topics Concern  . Not on file  Social History Narrative  . Not on file    Family History  Problem  Relation Age of Onset  . Cancer Father        skin  . Alzheimer's disease Mother   . Cancer Brother        skin  . Heart attack Paternal Grandfather      Review of Systems  Constitutional: Negative.  Negative for chills and fever.  HENT: Negative.  Negative for congestion and sore throat.   Eyes: Negative.   Respiratory: Negative.  Negative for cough and shortness of breath.   Cardiovascular:       Intermittent tachycardias mostly asymptomatic  Musculoskeletal: Positive for back pain (Intermittent).  Skin: Negative.   Neurological: Positive for dizziness (Occasional). Negative for sensory change, speech change, focal weakness, seizures, loss of consciousness and weakness.  All other systems reviewed and are negative.    Today's Vitals   07/09/19 0934  BP: 108/72  Pulse: 66  Resp: 16  Temp: 98.1 F (36.7 C)  TempSrc: Oral  SpO2: 98%  Weight: 163 lb 6.4 oz (74.1 kg)  Height: 5' 7.25" (1.708 m)   Body mass index is 25.4 kg/m.   Physical Exam Vitals signs reviewed.  Constitutional:      Appearance: Normal appearance.  HENT:     Head: Normocephalic.  Eyes:     Conjunctiva/sclera: Conjunctivae normal.     Pupils: Pupils are equal, round, and reactive to light.  Neck:     Musculoskeletal: Normal range of motion and neck supple.     Vascular: No carotid bruit.  Cardiovascular:     Rate and Rhythm: Normal rate and regular rhythm.     Pulses: Normal pulses.     Heart sounds: Normal heart sounds.  Pulmonary:     Effort: Pulmonary effort is normal.     Breath sounds: Normal breath sounds.  Abdominal:     General: There is no distension.     Palpations: Abdomen is soft.     Tenderness: There is no abdominal tenderness.  Musculoskeletal: Normal range of motion.        General: No swelling or tenderness.     Right lower leg: No edema.     Left lower leg: No edema.  Lymphadenopathy:     Cervical: No cervical adenopathy.  Skin:    General: Skin is warm and dry.      Capillary Refill: Capillary refill takes less than 2 seconds.  Neurological:     General: No focal deficit present.     Mental Status: She is alert and oriented to person, place, and time.  Psychiatric:  Mood and Affect: Mood normal.        Behavior: Behavior normal.      ASSESSMENT & PLAN: [name] was seen today for annual exam, medication refill and dizziness.  Diagnoses and all orders for this visit:  Routine general medical examination at a health care facility -     SAR CoV2 Serology (COVID 19)AB(IGG)IA  Hypothyroidism, unspecified type -     levothyroxine (SYNTHROID) 100 MCG tablet; Take 1 tablet (100 mcg total) by mouth daily.  Visit for screening mammogram -     MM Digital Screening; Future  Screening for deficiency anemia -     CBC with Differential/Platelet  Screening for lipoid disorders -     Lipid panel  Screening for endocrine, metabolic and immunity disorder -     Comprehensive metabolic panel -     POCT urinalysis dipstick  Need for shingles vaccine -     Varicella-zoster vaccine IM (Shingrix)  Other orders -     meloxicam (MOBIC) 15 MG tablet; Take 1 tablet (15 mg total) by mouth as needed for pain.    Patient Instructions       If you have lab work done today you will be contacted with your lab results within the next 2 weeks.  If you have not heard from us then please contact us. The fastest way to get your results is to register for My Chart.   IF you received an x-ray today, you will receive an invoice from [facility]. Please contact [facility] at [phone] with questions or concerns regarding your invoice.   IF you received labwork today, you will receive an invoice from [facility]. Please contact [facility] at [phone] with questions or concerns regarding your invoice.   Our billing staff will not be able to assist you with questions regarding bills from these companies.  You will be contacted with the lab  results as soon as they are available. The fastest way to get your results is to activate your My Chart account. Instructions are located on the last page of this paperwork. If you have not heard from us regarding the results in 2 weeks, please contact this office.      Health Maintenance, Female Adopting a healthy lifestyle and getting preventive care are important in promoting health and wellness. Ask your health care provider about:  The right schedule for you to have regular tests and exams.  Things you can do on your own to prevent diseases and keep yourself healthy. What should I know about diet, weight, and exercise? Eat a healthy diet   Eat a diet that includes plenty of vegetables, fruits, low-fat dairy products, and lean protein.  Do not eat a lot of foods that are high in solid fats, added sugars, or sodium. Maintain a healthy weight Body mass index (BMI) is used to identify weight problems. It estimates body fat based on height and weight. Your health care provider can help determine your BMI and help you achieve or maintain a healthy weight. Get regular exercise Get regular exercise. This is one of the most important things you can do for your health. Most adults should:  Exercise for at least 150 minutes each week. The exercise should increase your heart rate and make you sweat (moderate-intensity exercise).  Do strengthening exercises at least twice a week. This is in addition to the moderate-intensity exercise.  Spend less time sitting. Even light physical activity can be beneficial. Watch cholesterol and blood lipids Have your  blood tested for lipids and cholesterol at 54 years of age, then have this test every 5 years. Have your cholesterol levels checked more often if:  Your lipid or cholesterol levels are high.  You are older than 54 years of age.  You are at high risk for heart disease. What should I know about cancer screening? Depending on your health  history and family history, you may need to have cancer screening at various ages. This may include screening for:  Breast cancer.  Cervical cancer.  Colorectal cancer.  Skin cancer.  Lung cancer. What should I know about heart disease, diabetes, and high blood pressure? Blood pressure and heart disease  High blood pressure causes heart disease and increases the risk of stroke. This is more likely to develop in people who have high blood pressure readings, are of African descent, or are overweight.  Have your blood pressure checked: ? Every 3-5 years if you are 59-34 years of age. ? Every year if you are 55 years old or older. Diabetes Have regular diabetes screenings. This checks your fasting blood sugar level. Have the screening done:  Once every three years after age 19 if you are at a normal weight and have a low risk for diabetes.  More often and at a younger age if you are overweight or have a high risk for diabetes. What should I know about preventing infection? Hepatitis B If you have a higher risk for hepatitis B, you should be screened for this virus. Talk with your health care provider to find out if you are at risk for hepatitis B infection. Hepatitis C Testing is recommended for:  Everyone born from [birth year] through 1965.  Anyone with known risk factors for hepatitis C. Sexually transmitted infections (STIs)  Get screened for STIs, including gonorrhea and chlamydia, if: ? You are sexually active and are younger than 54 years of age. ? You are older than 54 years of age and your health care provider tells you that you are at risk for this type of infection. ? Your sexual activity has changed since you were last screened, and you are at increased risk for chlamydia or gonorrhea. Ask your health care provider if you are at risk.  Ask your health care provider about whether you are at high risk for HIV. Your health care provider may recommend a prescription medicine to  help prevent HIV infection. If you choose to take medicine to prevent HIV, you should first get tested for HIV. You should then be tested every 3 months for as long as you are taking the medicine. Pregnancy  If you are about to stop having your period (premenopausal) and you may become pregnant, seek counseling before you get pregnant.  Take 400 to 800 micrograms (mcg) of folic acid every day if you become pregnant.  Ask for birth control (contraception) if you want to prevent pregnancy. Osteoporosis and menopause Osteoporosis is a disease in which the bones lose minerals and strength with aging. This can result in bone fractures. If you are 77 years old or older, or if you are at risk for osteoporosis and fractures, ask your health care provider if you should:  Be screened for bone loss.  Take a calcium or vitamin D supplement to lower your risk of fractures.  Be given hormone replacement therapy (HRT) to treat symptoms of menopause. Follow these instructions at home: Lifestyle  Do not use any products that contain nicotine or tobacco, such as cigarettes, e-cigarettes, and  chewing tobacco. If you need help quitting, ask your health care provider.  Do not use street drugs.  Do not share needles.  Ask your health care provider for help if you need support or information about quitting drugs. Alcohol use  Do not drink alcohol if: ? Your health care provider tells you not to drink. ? You are pregnant, may be pregnant, or are planning to become pregnant.  If you drink alcohol: ? Limit how much you use to 0-1 drink a day. ? Limit intake if you are breastfeeding.  Be aware of how much alcohol is in your drink. In the [place] one drink equals one 12 oz bottle of beer (355 mL), one 5 oz glass of wine (148 mL), or one 1 oz glass of hard liquor (44 mL). General instructions  Schedule regular health, dental, and eye exams.  Stay current with your vaccines.  Tell your health care  provider if: ? You often feel depressed. ? You have ever been abused or do not feel safe at home. Summary  Adopting a healthy lifestyle and getting preventive care are important in promoting health and wellness.  Follow your health care provider's instructions about healthy diet, exercising, and getting tested or screened for diseases.  Follow your health care provider's instructions on monitoring your cholesterol and blood pressure. This information is not intended to replace advice given to you by your health care provider. Make sure you discuss any questions you have with your health care provider. Document Released: 03/20/2011 Document Revised: 08/28/2018 Document Reviewed: 08/28/2018 Elsevier Patient Education  2020 Elsevier Inc.      Agustina Caroli, MD [facility]

## 2019-07-10 ENCOUNTER — Encounter: Payer: Self-pay | Admitting: Emergency Medicine

## 2019-07-10 LAB — CBC WITH DIFFERENTIAL/PLATELET
Basophils Absolute: 0 10*3/uL (ref 0.0–0.2)
Basos: 1 %
EOS (ABSOLUTE): 0.3 10*3/uL (ref 0.0–0.4)
Eos: 6 %
Hematocrit: 43.7 % (ref 34.0–46.6)
Hemoglobin: 14 g/dL (ref 11.1–15.9)
Immature Grans (Abs): 0 10*3/uL (ref 0.0–0.1)
Immature Granulocytes: 0 %
Lymphocytes Absolute: 1.4 10*3/uL (ref 0.7–3.1)
Lymphs: 30 %
MCH: 29.6 pg (ref 26.6–33.0)
MCHC: 32 g/dL (ref 31.5–35.7)
MCV: 92 fL (ref 79–97)
Monocytes Absolute: 0.4 10*3/uL (ref 0.1–0.9)
Monocytes: 9 %
Neutrophils Absolute: 2.5 10*3/uL (ref 1.4–7.0)
Neutrophils: 54 %
Platelets: 221 10*3/uL (ref 150–450)
RBC: 4.73 x10E6/uL (ref 3.77–5.28)
RDW: 12.8 % (ref 11.7–15.4)
WBC: 4.6 10*3/uL (ref 3.4–10.8)

## 2019-07-10 LAB — COMPREHENSIVE METABOLIC PANEL
ALT: 17 IU/L (ref 0–32)
AST: 18 IU/L (ref 0–40)
Albumin/Globulin Ratio: 1.8 (ref 1.2–2.2)
Albumin: 4.4 g/dL (ref 3.8–4.9)
Alkaline Phosphatase: 71 IU/L (ref 39–117)
BUN/Creatinine Ratio: 12 (ref 9–23)
BUN: 10 mg/dL (ref 6–24)
Bilirubin Total: 0.6 mg/dL (ref 0.0–1.2)
CO2: 22 mmol/L (ref 20–29)
Calcium: 9.6 mg/dL (ref 8.7–10.2)
Chloride: 105 mmol/L (ref 96–106)
Creatinine, Ser: 0.84 mg/dL (ref 0.57–1.00)
GFR calc Af Amer: 91 mL/min/{1.73_m2} (ref >59)
GFR calc non Af Amer: 79 mL/min/{1.73_m2} (ref >59)
Globulin, Total: 2.4 g/dL (ref 1.5–4.5)
Glucose: 95 mg/dL (ref 65–99)
Potassium: 4.3 mmol/L (ref 3.5–5.2)
Sodium: 141 mmol/L (ref 134–144)
Total Protein: 6.8 g/dL (ref 6.0–8.5)

## 2019-07-10 LAB — LIPID PANEL
Chol/HDL Ratio: 2.2 ratio (ref 0.0–4.4)
Cholesterol, Total: 189 mg/dL (ref 100–199)
HDL: 86 mg/dL (ref >39)
LDL Chol Calc (NIH): 94 mg/dL (ref 0–99)
Triglycerides: 44 mg/dL (ref 0–149)
VLDL Cholesterol Cal: 9 mg/dL (ref 5–40)

## 2019-07-10 LAB — SAR COV2 SEROLOGY (COVID19)AB(IGG),IA

## 2019-07-10 LAB — EUROIMMUN SARS-COV-2 AB, IGG: Euroimmun SARS-CoV-2 Ab, IgG: NEGATIVE

## 2019-07-24 ENCOUNTER — Other Ambulatory Visit: Payer: Self-pay | Admitting: *Deleted

## 2019-07-24 ENCOUNTER — Telehealth: Payer: Self-pay | Admitting: *Deleted

## 2019-07-24 DIAGNOSIS — Z23 Encounter for immunization: Secondary | ICD-10-CM

## 2019-07-24 NOTE — Telephone Encounter (Signed)
Rx for Shingrix was e-scribed to [facility] for first and second dose of schedule guidelines, requested by patient. Dr Mitchel Honour was advised. Confirmation page 5:16 pm.

## 2019-11-12 ENCOUNTER — Ambulatory Visit
Admission: RE | Admit: 2019-11-12 | Discharge: 2019-11-12 | Disposition: A | Discharge status: Home / Self Care | Payer: BC Managed Care – PPO | Source: Ambulatory Visit | Attending: Emergency Medicine | Admitting: Emergency Medicine

## 2019-11-12 ENCOUNTER — Ambulatory Visit: Payer: BC Managed Care – PPO

## 2019-11-12 ENCOUNTER — Other Ambulatory Visit: Payer: Self-pay

## 2019-11-12 DIAGNOSIS — Z1231 Encounter for screening mammogram for malignant neoplasm of breast: Secondary | ICD-10-CM

## 2019-11-14 ENCOUNTER — Other Ambulatory Visit: Payer: Self-pay | Admitting: Emergency Medicine

## 2019-11-14 DIAGNOSIS — R928 Other abnormal and inconclusive findings on diagnostic imaging of breast: Secondary | ICD-10-CM

## 2019-11-18 ENCOUNTER — Telehealth: Payer: Self-pay | Admitting: *Deleted

## 2019-11-18 NOTE — Telephone Encounter (Signed)
Faxed signed order to [facility]. Confirmation page 5:35 pm.

## 2019-12-24 ENCOUNTER — Ambulatory Visit
Admission: RE | Admit: 2019-12-24 | Discharge: 2019-12-24 | Disposition: A | Discharge status: Home / Self Care | Payer: BC Managed Care – PPO | Source: Ambulatory Visit | Attending: Emergency Medicine | Admitting: Emergency Medicine

## 2019-12-24 ENCOUNTER — Other Ambulatory Visit: Payer: Self-pay

## 2019-12-24 DIAGNOSIS — R928 Other abnormal and inconclusive findings on diagnostic imaging of breast: Secondary | ICD-10-CM

## 2020-08-26 ENCOUNTER — Telehealth: Payer: Self-pay | Admitting: Emergency Medicine

## 2020-08-26 NOTE — Telephone Encounter (Signed)
I called Leslie Gardner's office with questions concerning the release they sent over for this pt. Are they wanting our records?  Not sure.

## 2020-12-24 ENCOUNTER — Other Ambulatory Visit: Payer: Self-pay | Admitting: Family Medicine

## 2020-12-24 DIAGNOSIS — Z1231 Encounter for screening mammogram for malignant neoplasm of breast: Secondary | ICD-10-CM

## 2020-12-25 ENCOUNTER — Ambulatory Visit
Admission: RE | Admit: 2020-12-25 | Discharge: 2020-12-25 | Disposition: A | Discharge status: Home / Self Care | Payer: BC Managed Care – PPO | Source: Ambulatory Visit | Attending: Family Medicine | Admitting: Family Medicine

## 2020-12-25 ENCOUNTER — Other Ambulatory Visit: Payer: Self-pay

## 2020-12-25 DIAGNOSIS — Z1231 Encounter for screening mammogram for malignant neoplasm of breast: Secondary | ICD-10-CM

## 2020-12-28 ENCOUNTER — Other Ambulatory Visit: Payer: Self-pay | Admitting: Family Medicine

## 2020-12-28 DIAGNOSIS — R928 Other abnormal and inconclusive findings on diagnostic imaging of breast: Secondary | ICD-10-CM

## 2021-02-11 ENCOUNTER — Ambulatory Visit
Admission: RE | Admit: 2021-02-11 | Discharge: 2021-02-11 | Disposition: A | Discharge status: Home / Self Care | Payer: BC Managed Care – PPO | Source: Ambulatory Visit | Attending: Family Medicine | Admitting: Family Medicine

## 2021-02-11 ENCOUNTER — Other Ambulatory Visit: Payer: Self-pay

## 2021-02-11 DIAGNOSIS — R928 Other abnormal and inconclusive findings on diagnostic imaging of breast: Secondary | ICD-10-CM

## 2022-02-08 ENCOUNTER — Other Ambulatory Visit: Payer: Self-pay | Admitting: Family Medicine

## 2022-02-08 DIAGNOSIS — Z1231 Encounter for screening mammogram for malignant neoplasm of breast: Secondary | ICD-10-CM

## 2022-02-21 ENCOUNTER — Ambulatory Visit
Admission: RE | Admit: 2022-02-21 | Discharge: 2022-02-21 | Disposition: A | Discharge status: Home / Self Care | Payer: BC Managed Care – PPO | Source: Ambulatory Visit | Attending: Family Medicine | Admitting: Family Medicine

## 2022-02-21 DIAGNOSIS — Z1231 Encounter for screening mammogram for malignant neoplasm of breast: Secondary | ICD-10-CM

## 2022-12-05 ENCOUNTER — Other Ambulatory Visit: Payer: Self-pay | Admitting: Family Medicine

## 2022-12-05 DIAGNOSIS — Z1231 Encounter for screening mammogram for malignant neoplasm of breast: Secondary | ICD-10-CM

## 2022-12-08 ENCOUNTER — Other Ambulatory Visit: Payer: Self-pay | Admitting: Family Medicine

## 2022-12-08 DIAGNOSIS — E559 Vitamin D deficiency, unspecified: Secondary | ICD-10-CM

## 2023-02-23 ENCOUNTER — Ambulatory Visit
Admission: RE | Admit: 2023-02-23 | Discharge: 2023-02-23 | Disposition: A | Discharge status: Home / Self Care | Payer: BC Managed Care – PPO | Source: Ambulatory Visit | Attending: Family Medicine | Admitting: Family Medicine

## 2023-02-23 DIAGNOSIS — Z1231 Encounter for screening mammogram for malignant neoplasm of breast: Secondary | ICD-10-CM

## 2023-06-14 ENCOUNTER — Ambulatory Visit
Admission: RE | Admit: 2023-06-14 | Discharge: 2023-06-14 | Disposition: A | Discharge status: Home / Self Care | Payer: BC Managed Care – PPO | Source: Ambulatory Visit | Attending: Family Medicine | Admitting: Family Medicine

## 2023-06-14 DIAGNOSIS — E559 Vitamin D deficiency, unspecified: Secondary | ICD-10-CM

## 2024-05-02 ENCOUNTER — Other Ambulatory Visit: Payer: Self-pay | Admitting: Family Medicine

## 2024-05-02 DIAGNOSIS — Z1231 Encounter for screening mammogram for malignant neoplasm of breast: Secondary | ICD-10-CM

## 2024-05-29 ENCOUNTER — Ambulatory Visit
Admission: RE | Admit: 2024-05-29 | Discharge: 2024-05-29 | Disposition: A | Discharge status: Home / Self Care | Payer: Self-pay | Source: Ambulatory Visit | Attending: Family Medicine | Admitting: Family Medicine

## 2024-05-29 DIAGNOSIS — Z1231 Encounter for screening mammogram for malignant neoplasm of breast: Secondary | ICD-10-CM

## 2024-08-04 ENCOUNTER — Other Ambulatory Visit: Payer: Self-pay | Admitting: Family Medicine

## 2024-08-04 DIAGNOSIS — R921 Mammographic calcification found on diagnostic imaging of breast: Secondary | ICD-10-CM

## 2024-08-04 DIAGNOSIS — N644 Mastodynia: Secondary | ICD-10-CM

## 2024-08-06 ENCOUNTER — Ambulatory Visit
Admission: RE | Admit: 2024-08-06 | Discharge: 2024-08-06 | Disposition: A | Discharge status: Home / Self Care | Source: Ambulatory Visit | Attending: Family Medicine | Admitting: Family Medicine

## 2024-08-06 DIAGNOSIS — N644 Mastodynia: Secondary | ICD-10-CM
